# Patient Record
Sex: Female | Born: 1960 | Race: White | Hispanic: No | Marital: Married | Smoking: Current every day smoker
Health system: Southern US, Community
[De-identification: ages and names within clinical notes are randomized; demographics above are authoritative.]

## PROBLEM LIST (undated history)

## (undated) DIAGNOSIS — R0602 Shortness of breath: Secondary | ICD-10-CM

## (undated) DIAGNOSIS — J189 Pneumonia, unspecified organism: Secondary | ICD-10-CM

## (undated) DIAGNOSIS — F419 Anxiety disorder, unspecified: Secondary | ICD-10-CM

## (undated) DIAGNOSIS — R112 Nausea with vomiting, unspecified: Secondary | ICD-10-CM

## (undated) DIAGNOSIS — T41295S Adverse effect of other general anesthetics, sequela: Secondary | ICD-10-CM

## (undated) DIAGNOSIS — K219 Gastro-esophageal reflux disease without esophagitis: Secondary | ICD-10-CM

## (undated) DIAGNOSIS — M1990 Unspecified osteoarthritis, unspecified site: Secondary | ICD-10-CM

## (undated) DIAGNOSIS — I10 Essential (primary) hypertension: Secondary | ICD-10-CM

## (undated) DIAGNOSIS — B029 Zoster without complications: Secondary | ICD-10-CM

## (undated) DIAGNOSIS — F329 Major depressive disorder, single episode, unspecified: Secondary | ICD-10-CM

## (undated) DIAGNOSIS — Z9889 Other specified postprocedural states: Secondary | ICD-10-CM

## (undated) DIAGNOSIS — F32A Depression, unspecified: Secondary | ICD-10-CM

## (undated) DIAGNOSIS — E669 Obesity, unspecified: Secondary | ICD-10-CM

## (undated) HISTORY — PX: TONSILLECTOMY: SUR1361

## (undated) HISTORY — DX: Major depressive disorder, single episode, unspecified: F32.9

## (undated) HISTORY — DX: Gastro-esophageal reflux disease without esophagitis: K21.9

## (undated) HISTORY — DX: Depression, unspecified: F32.A

## (undated) HISTORY — DX: Essential (primary) hypertension: I10

## (undated) HISTORY — PX: OTHER SURGICAL HISTORY: SHX169

## (undated) HISTORY — DX: Obesity, unspecified: E66.9

---

## 1999-01-31 ENCOUNTER — Other Ambulatory Visit: Admission: RE | Admit: 1999-01-31 | Discharge: 1999-01-31 | Payer: Self-pay | Admitting: Obstetrics & Gynecology

## 2000-02-11 ENCOUNTER — Other Ambulatory Visit: Admission: RE | Admit: 2000-02-11 | Discharge: 2000-02-11 | Payer: Self-pay | Admitting: Obstetrics & Gynecology

## 2001-07-06 ENCOUNTER — Encounter: Payer: Self-pay | Admitting: *Deleted

## 2001-07-06 ENCOUNTER — Encounter: Admission: RE | Admit: 2001-07-06 | Discharge: 2001-07-06 | Payer: Self-pay | Admitting: *Deleted

## 2001-07-08 ENCOUNTER — Encounter: Admission: RE | Admit: 2001-07-08 | Discharge: 2001-07-08 | Payer: Self-pay | Admitting: *Deleted

## 2001-07-08 ENCOUNTER — Encounter: Payer: Self-pay | Admitting: *Deleted

## 2001-08-22 ENCOUNTER — Encounter: Payer: Self-pay | Admitting: Family Medicine

## 2001-08-22 ENCOUNTER — Inpatient Hospital Stay (HOSPITAL_COMMUNITY): Admission: EM | Admit: 2001-08-22 | Discharge: 2001-08-24 | Payer: Self-pay | Admitting: Emergency Medicine

## 2001-08-24 ENCOUNTER — Encounter: Payer: Self-pay | Admitting: *Deleted

## 2001-09-10 ENCOUNTER — Encounter: Payer: Self-pay | Admitting: *Deleted

## 2001-09-10 ENCOUNTER — Encounter: Admission: RE | Admit: 2001-09-10 | Discharge: 2001-09-10 | Payer: Self-pay | Admitting: *Deleted

## 2001-12-16 ENCOUNTER — Other Ambulatory Visit: Admission: RE | Admit: 2001-12-16 | Discharge: 2001-12-16 | Payer: Self-pay | Admitting: Obstetrics & Gynecology

## 2002-11-04 ENCOUNTER — Ambulatory Visit (HOSPITAL_COMMUNITY): Admission: RE | Admit: 2002-11-04 | Discharge: 2002-11-04 | Payer: Self-pay | Admitting: Obstetrics & Gynecology

## 2002-11-04 ENCOUNTER — Encounter: Payer: Self-pay | Admitting: Obstetrics & Gynecology

## 2002-11-07 ENCOUNTER — Encounter: Payer: Self-pay | Admitting: Emergency Medicine

## 2002-11-07 ENCOUNTER — Emergency Department (HOSPITAL_COMMUNITY): Admission: EM | Admit: 2002-11-07 | Discharge: 2002-11-07 | Payer: Self-pay | Admitting: Emergency Medicine

## 2002-11-25 ENCOUNTER — Encounter: Admission: RE | Admit: 2002-11-25 | Discharge: 2002-11-25 | Payer: Self-pay

## 2002-12-14 ENCOUNTER — Ambulatory Visit (HOSPITAL_COMMUNITY): Admission: RE | Admit: 2002-12-14 | Discharge: 2002-12-14 | Payer: Self-pay | Admitting: Gastroenterology

## 2002-12-14 ENCOUNTER — Encounter (INDEPENDENT_AMBULATORY_CARE_PROVIDER_SITE_OTHER): Payer: Self-pay | Admitting: Specialist

## 2003-02-14 ENCOUNTER — Other Ambulatory Visit: Admission: RE | Admit: 2003-02-14 | Discharge: 2003-02-14 | Payer: Self-pay | Admitting: Obstetrics & Gynecology

## 2003-02-21 ENCOUNTER — Inpatient Hospital Stay (HOSPITAL_COMMUNITY): Admission: AD | Admit: 2003-02-21 | Discharge: 2003-02-21 | Payer: Self-pay | Admitting: Obstetrics and Gynecology

## 2003-03-17 ENCOUNTER — Ambulatory Visit (HOSPITAL_COMMUNITY): Admission: RE | Admit: 2003-03-17 | Discharge: 2003-03-17 | Payer: Self-pay | Admitting: Neurosurgery

## 2003-03-17 ENCOUNTER — Encounter: Payer: Self-pay | Admitting: Neurosurgery

## 2003-06-14 ENCOUNTER — Emergency Department (HOSPITAL_COMMUNITY): Admission: AD | Admit: 2003-06-14 | Discharge: 2003-06-14 | Payer: Self-pay | Admitting: Family Medicine

## 2003-06-20 ENCOUNTER — Encounter: Admission: RE | Admit: 2003-06-20 | Discharge: 2003-06-20 | Payer: Self-pay | Admitting: Family Medicine

## 2004-03-06 ENCOUNTER — Other Ambulatory Visit: Admission: RE | Admit: 2004-03-06 | Discharge: 2004-03-06 | Payer: Self-pay | Admitting: Obstetrics & Gynecology

## 2005-03-13 ENCOUNTER — Other Ambulatory Visit: Admission: RE | Admit: 2005-03-13 | Discharge: 2005-03-13 | Payer: Self-pay | Admitting: Obstetrics & Gynecology

## 2005-09-23 ENCOUNTER — Emergency Department (HOSPITAL_COMMUNITY): Admission: EM | Admit: 2005-09-23 | Discharge: 2005-09-23 | Payer: Self-pay | Admitting: Emergency Medicine

## 2006-12-24 ENCOUNTER — Emergency Department (HOSPITAL_COMMUNITY): Admission: EM | Admit: 2006-12-24 | Discharge: 2006-12-24 | Payer: Self-pay | Admitting: Emergency Medicine

## 2008-11-14 ENCOUNTER — Encounter
Admission: RE | Admit: 2008-11-14 | Discharge: 2008-11-14 | Payer: Self-pay | Admitting: Physical Medicine and Rehabilitation

## 2009-10-22 ENCOUNTER — Encounter: Admission: RE | Admit: 2009-10-22 | Discharge: 2009-10-22 | Payer: Self-pay | Admitting: Family Medicine

## 2009-10-24 ENCOUNTER — Encounter: Admission: RE | Admit: 2009-10-24 | Discharge: 2009-10-24 | Payer: Self-pay | Admitting: Internal Medicine

## 2009-10-25 ENCOUNTER — Emergency Department (HOSPITAL_COMMUNITY): Admission: EM | Admit: 2009-10-25 | Discharge: 2009-10-25 | Payer: Self-pay | Admitting: Emergency Medicine

## 2009-10-25 ENCOUNTER — Emergency Department (HOSPITAL_COMMUNITY): Admission: EM | Admit: 2009-10-25 | Discharge: 2009-10-26 | Payer: Self-pay | Admitting: Emergency Medicine

## 2009-11-14 ENCOUNTER — Ambulatory Visit (HOSPITAL_COMMUNITY): Admission: RE | Admit: 2009-11-14 | Discharge: 2009-11-14 | Payer: Self-pay | Admitting: Gastroenterology

## 2009-11-16 ENCOUNTER — Inpatient Hospital Stay (HOSPITAL_COMMUNITY): Admission: EM | Admit: 2009-11-16 | Discharge: 2009-11-17 | Payer: Self-pay | Admitting: Emergency Medicine

## 2009-11-17 ENCOUNTER — Encounter (INDEPENDENT_AMBULATORY_CARE_PROVIDER_SITE_OTHER): Payer: Self-pay | Admitting: General Surgery

## 2010-06-16 HISTORY — PX: CHOLECYSTECTOMY: SHX55

## 2010-09-02 ENCOUNTER — Other Ambulatory Visit: Payer: Self-pay | Admitting: Internal Medicine

## 2010-09-02 DIAGNOSIS — R1310 Dysphagia, unspecified: Secondary | ICD-10-CM

## 2010-09-02 LAB — COMPREHENSIVE METABOLIC PANEL
ALT: 22 U/L (ref 0–35)
AST: 24 U/L (ref 0–37)
Albumin: 3.2 g/dL — ABNORMAL LOW (ref 3.5–5.2)
Alkaline Phosphatase: 82 U/L (ref 39–117)
Alkaline Phosphatase: 87 U/L (ref 39–117)
BUN: 10 mg/dL (ref 6–23)
BUN: 8 mg/dL (ref 6–23)
CO2: 23 mEq/L (ref 19–32)
Calcium: 9.2 mg/dL (ref 8.4–10.5)
Chloride: 108 mEq/L (ref 96–112)
GFR calc Af Amer: >60 mL/min (ref >60)
GFR calc non Af Amer: >60 mL/min (ref >60)
Glucose, Bld: 94 mg/dL (ref 70–99)
Potassium: 3.8 mEq/L (ref 3.5–5.1)
Sodium: 139 mEq/L (ref 135–145)
Total Bilirubin: 0.5 mg/dL (ref 0.3–1.2)
Total Protein: 6.1 g/dL (ref 6.0–8.3)
Total Protein: 7 g/dL (ref 6.0–8.3)

## 2010-09-02 LAB — LIPASE, BLOOD: Lipase: 42 U/L (ref 11–59)

## 2010-09-02 LAB — DIFFERENTIAL
Basophils Relative: 1 % (ref 0–1)
Eosinophils Absolute: 0.5 10*3/uL (ref 0.0–0.7)
Lymphs Abs: 4.2 10*3/uL — ABNORMAL HIGH (ref 0.7–4.0)
Monocytes Relative: 6 % (ref 3–12)
Neutro Abs: 4.3 10*3/uL (ref 1.7–7.7)
Neutrophils Relative %: 45 % (ref 43–77)

## 2010-09-02 LAB — URINALYSIS, ROUTINE W REFLEX MICROSCOPIC
Bilirubin Urine: NEGATIVE
Glucose, UA: NEGATIVE mg/dL
Glucose, UA: NEGATIVE mg/dL
Hgb urine dipstick: NEGATIVE
Ketones, ur: NEGATIVE mg/dL
Ketones, ur: NEGATIVE mg/dL
Nitrite: NEGATIVE
Protein, ur: NEGATIVE mg/dL
Protein, ur: NEGATIVE mg/dL
Urobilinogen, UA: 0.2 mg/dL (ref 0.0–1.0)

## 2010-09-02 LAB — CBC
HCT: 40.2 % (ref 36.0–46.0)
HCT: 45.1 % (ref 36.0–46.0)
Hemoglobin: 15.4 g/dL — ABNORMAL HIGH (ref 12.0–15.0)
MCHC: 34.1 g/dL (ref 30.0–36.0)
Platelets: 236 10*3/uL (ref 150–400)
RBC: 4.99 MIL/uL (ref 3.87–5.11)
RDW: 13.9 % (ref 11.5–15.5)
RDW: 14.4 % (ref 11.5–15.5)
WBC: 8 10*3/uL (ref 4.0–10.5)

## 2010-09-03 LAB — DIFFERENTIAL
Basophils Relative: 1 % (ref 0–1)
Eosinophils Absolute: 0.6 10*3/uL (ref 0.0–0.7)
Eosinophils Relative: 7 % — ABNORMAL HIGH (ref 0–5)
Lymphs Abs: 3.6 10*3/uL (ref 0.7–4.0)
Neutrophils Relative %: 46 % (ref 43–77)

## 2010-09-03 LAB — COMPREHENSIVE METABOLIC PANEL
AST: 26 U/L (ref 0–37)
CO2: 22 mEq/L (ref 19–32)
Calcium: 8.9 mg/dL (ref 8.4–10.5)
Creatinine, Ser: 0.97 mg/dL (ref 0.4–1.2)
GFR calc Af Amer: >60 mL/min (ref >60)
GFR calc non Af Amer: >60 mL/min (ref >60)
Glucose, Bld: 109 mg/dL — ABNORMAL HIGH (ref 70–99)
Sodium: 137 mEq/L (ref 135–145)
Total Protein: 6.2 g/dL (ref 6.0–8.3)

## 2010-09-03 LAB — POCT I-STAT, CHEM 8
BUN: 10 mg/dL (ref 6–23)
Chloride: 108 mEq/L (ref 96–112)
HCT: 43 % (ref 36.0–46.0)
Potassium: 4.2 mEq/L (ref 3.5–5.1)

## 2010-09-03 LAB — CBC
HCT: 41 % (ref 36.0–46.0)
MCHC: 35.2 g/dL (ref 30.0–36.0)
MCV: 89 fL (ref 78.0–100.0)
Platelets: 235 10*3/uL (ref 150–400)
WBC: 8.7 10*3/uL (ref 4.0–10.5)

## 2010-09-03 LAB — LIPASE, BLOOD: Lipase: 32 U/L (ref 11–59)

## 2010-09-05 ENCOUNTER — Ambulatory Visit
Admission: RE | Admit: 2010-09-05 | Discharge: 2010-09-05 | Disposition: A | Discharge status: Home / Self Care | Payer: BC Managed Care – PPO | Source: Ambulatory Visit | Attending: Internal Medicine | Admitting: Internal Medicine

## 2010-09-05 DIAGNOSIS — R1310 Dysphagia, unspecified: Secondary | ICD-10-CM

## 2010-09-18 ENCOUNTER — Ambulatory Visit (HOSPITAL_BASED_OUTPATIENT_CLINIC_OR_DEPARTMENT_OTHER)
Admission: RE | Admit: 2010-09-18 | Discharge: 2010-09-18 | Disposition: A | Discharge status: Home / Self Care | Payer: BC Managed Care – PPO | Source: Ambulatory Visit | Attending: Orthopedic Surgery | Admitting: Orthopedic Surgery

## 2010-09-18 DIAGNOSIS — Z79899 Other long term (current) drug therapy: Secondary | ICD-10-CM | POA: Insufficient documentation

## 2010-09-18 DIAGNOSIS — K219 Gastro-esophageal reflux disease without esophagitis: Secondary | ICD-10-CM | POA: Insufficient documentation

## 2010-09-18 DIAGNOSIS — F17200 Nicotine dependence, unspecified, uncomplicated: Secondary | ICD-10-CM | POA: Insufficient documentation

## 2010-09-18 DIAGNOSIS — X58XXXA Exposure to other specified factors, initial encounter: Secondary | ICD-10-CM | POA: Insufficient documentation

## 2010-09-18 DIAGNOSIS — S83289A Other tear of lateral meniscus, current injury, unspecified knee, initial encounter: Secondary | ICD-10-CM | POA: Insufficient documentation

## 2010-09-18 DIAGNOSIS — M948X9 Other specified disorders of cartilage, unspecified sites: Secondary | ICD-10-CM | POA: Insufficient documentation

## 2010-09-18 DIAGNOSIS — I10 Essential (primary) hypertension: Secondary | ICD-10-CM | POA: Insufficient documentation

## 2010-09-18 LAB — POCT I-STAT 4, (NA,K, GLUC, HGB,HCT)
Glucose, Bld: 133 mg/dL — ABNORMAL HIGH (ref 70–99)
HCT: 45 % (ref 36.0–46.0)
Hemoglobin: 15.3 g/dL — ABNORMAL HIGH (ref 12.0–15.0)
Sodium: 136 mEq/L (ref 135–145)

## 2010-09-23 NOTE — Op Note (Signed)
Brittany Moon, Brittany Moon                 ACCOUNT NO.:  [id number]  MEDICAL RECORD NO.:  [id number]            PATIENT TYPE:  LOCATION:                                 FACILITY:  PHYSICIAN:  Ollen Gross, M.D.         DATE OF BIRTH:  DATE OF PROCEDURE:  09/18/2010 DATE OF DISCHARGE:                              OPERATIVE REPORT   PREOPERATIVE DIAGNOSIS:  Right knee lateral meniscal tear.  POSTOPERATIVE DIAGNOSES:  Right knee lateral meniscal tear plus chondral defects medial, lateral, and patellofemoral.  PROCEDURE:  Right knee arthroscopy with meniscal debridement and chondroplasty.  SURGEON:  Ollen Gross, MD  ASSISTANT:  None.  ANESTHESIA:  General.  ESTIMATED BLOOD LOSS:  Minimal.  DRAIN:  None.  COMPLICATIONS:  None.  CONDITION:  Stable to Recovery.  BRIEF CLINICAL NOTE:  Brittany Moon is a 50 year old female with significant right knee pain as well as mechanical type symptoms.  She does have some early deterioration of the joint.  MRI did show meniscal tear.  She also has a painful popping medially.  She presents now for arthroscopy and debridement.  PROCEDURE IN DETAIL:  After successful administration of general anesthetic, a tourniquet was placed high on the right thigh and right lower extremity prepped and draped in usual sterile fashion.  Standard superomedial and inferolateral incisions were made, inflow cannula passed superomedial and camera passed inferolateral.  Arthroscopic visualization proceeds.  Undersurface of patella shows some grade 3 and focal areas of grade 4 change.  There was some unstable cartilage under the patella.  It was mainly in the medial and mid aspect.  The trochlea showed minimal chondromalacia.  There was a lot of synovitis in suprapatellar area.  Medial and lateral gutters were visualized, there were no loose bodies.  Flexion and valgus force applied to the knee and medial compartment was entered.  There was no evidence of any  meniscal tear.  Spinal needle was used to localize the inferomedial portal, small incision made, and dilator placed.  There was about a 2 x 2 cm area on the medial femoral condyle that had some unstable cartilage.  Centrally, there was a small area of exposed bone with this.  I probed it and it was unstable.  I debrided this back to a stable bony base with stable cartilaginous edges.  I then abraded the bone for hopeful formation of fibrocartilage.  The edges were probed and found to be stable.  The medial tibial plateau looked normal.  Intercondylar notch was visualized, the ACL was normal.  Lateral compartment was entered and there was some unstable cartilage on the surface of the lateral tibial plateau.  There was also a tear in the body and posterior horn of the lateral meniscus.  I first debrided the meniscus back to a stable base with baskets and a 4.2-mm shaver and sealed off with the ArthroCare device.  The unstable cartilage on the surface of tibial plateau as well as on the medial femoral condyle was debrided back to stable basis. Size was about 1 x 1 cm on plateau and 1 x 1 cm  on the lateral condyle. The patellofemoral joint was then addressed and the unstable cartilage under the patella was debrided back to a stable cartilaginous base. Once again, there were some areas of exposed bone on the patella.  The synovitis was then debrided with the ArthroCare device.  I again inspected the joint and of note, there appears to be a cyst coming off the medial meniscus.  It was emanating up into the medial gutter.  This potentially could be causing some of the mechanical symptoms.  I debrided the cyst with a shaver and then sealed off the stalk of it with the ArthroCare device.  The joint was inspected further, no other tears, loose bodies, or defects were noted.  The arthroscopic equipment was removed from the inferior portals which were closed with interrupted 4-0 nylon.  A 20 cc of  0.25% Marcaine with epinephrine injected through the inflow cannula and that was removed and that portal was closed with nylon.  A bulky sterile dressing was applied and she was awakened and transported to Recovery in stable condition.     Ollen Gross, M.D.     FA/MEDQ  D:  09/18/2010  T:  09/19/2010  Job:  [id number]  Electronically Signed by Ollen Gross M.D. on 09/23/2010 06:38:57 AM

## 2010-11-01 NOTE — Op Note (Signed)
NAMEANISA, LEANOS                             ACCOUNT NO.:  [id number]   MEDICAL RECORD NO.:  [id number]                   PATIENT TYPE:  AMB   LOCATION:  ENDO                                 FACILITY:  [facility]   PHYSICIAN:  Anselmo Rod, M.D.               DATE OF BIRTH:  05/10/1961   DATE OF PROCEDURE:  12/14/2002  DATE OF DISCHARGE:                                 OPERATIVE REPORT   PROCEDURE PERFORMED:  Colonoscopy with cold biopsies x4.   ENDOSCOPIST:  Anselmo Rod, M.D.   INSTRUMENT USED:  Olympus video colonoscope, (adjustable pediatric scope).   INDICATIONS FOR PROCEDURE:  Rectal bleeding in a 50 year old [place] female.  Rule out colonic polyps, masses, etc.   PREPROCEDURE PREPARATION:  Informed consent was procured from the patient.  The patient was fasted for eight hours prior to the procedure and prepped  with a bottle of magnesium citrate and a gallon of GoLYTELY the night prior  to the procedure.   PREPROCEDURE PHYSICAL:  VITAL SIGNS:  The patient had stable vital signs.  NECK:  Supple.  CHEST: Clear to auscultation.  S1 and S2 regular.  ABDOMEN: Soft with normal bowel sounds.   DESCRIPTION OF PROCEDURE:  The patient was placed in the left lateral  decubitus position and sedated with 80 mg of Demerol and 8 mg of Versed  intravenously.  Once the patient was adequately sedated and maintained on  low flow oxygen and continuous cardiac monitoring, the Olympus video  colonoscope was advanced from the rectum to the cecum without difficulty.  The patient had a fairly good prep.  Two small sessile polyps were biopsied  from 20 cm and another two small sessile polyps were biopsied from 100 cc  with the cold biopsy forceps.  Small internal hemorrhoids were seen on  retroflexion in the rectum.  No masses, large polyps, erosions or  ulcerations were seen.  There was  no evidence of diverticulosis.  The  terminal ileum appeared normal.  The appendiceal orifice and the  ileocecal  valve were clearly visualized and photographed.   IMPRESSION:  1. Four small sessile polyps, biopsied from the colon.  2. No evidence of diverticulosis noted.  3. Normal terminal ileum.    RECOMMENDATIONS:  1. Await pathology results.  2. High fiber diet with liberal fluid intake to help with constipation.  3. I will advise the patient to follow up in the next two weeks or earlier     if need be.                                                 Anselmo Rod, M.D.    JNM/MEDQ  D:  12/14/2002  T:  12/14/2002  Job:  [id number]  cc:   Christella Noa, M.D.  [address] [place]  [place], [place] [address]  Fax: [phone]

## 2010-11-01 NOTE — Discharge Summary (Signed)
[facility]  Patient:    Aralynn, Brake [facility] Visit Number: [id number] MRN: [id number]          Service Type: MED Location: [phone] Attending Physician:  Heather Roberts Dictated by:   Heather Roberts, M.D. Admit Date:  08/22/2001 Discharge Date: 08/24/2001                             Discharge Summary  DISCHARGE DIAGNOSES: 1. Bilateral pneumonia. 2. Hypoxia. 3. Smoker. 4. Gastroesophageal reflux disease. 5. History of depression.  HISTORY OF PRESENT ILLNESS:  Brittany Moon is a 50 year old [place] married female smoker admitted through the emergency room for treatment of pneumonia.  She had a three-day history of fevers, chills, arthralgias, myalgias followed by increasingly productive cough and shortness of breath.  In the emergency room chest x-ray showed bilateral pneumonia.  ABGs on room air showed pH 7.4, pO2 68, pCO2 30.  The patient was tachypneic and in sinus tachycardia.  For physical examination, family/social, past medical history, see dictated History and Physical.  LABORATORY DATA:  Admission Carmicheal count 5, subsequent 14, hemoglobin negative. SGOT, PT minimally elevated at 62 and 73.  Sodium 134.  Blood cultures x2 negative at the time of discharge.  Sputum culture was acceptable pending at the time of discharge. Pulse oximetry at the time of discharge on room air was 95%.  Chest x-ray on admission showed bilateral pneumonia, minimal improvement two days after admission.  HOSPITAL COURSE:  Sanaia was admitted for treatment of hypoxia, pneumonia and begun on Tequin.  She was able to tolerate p.o. medicines, but became dyspneic on getting out of the bed and had slow improvement.  She was discharged in fair condition on August 24, 2001 to her home with home O2 and nebulizer treatments.  She was continued on prednisone, albuterol by nebulizer or inhaler, Tequin, and Biaxin.  Follow-up is to be arranged with Dr. Orson Aloe a week after  discharge at which time LFTs will be repeated.  She was initially having some difficulty tolerating food with nausea but this has resolved at the time of discharge.  She had had a negative gallbladder ultrasound a month prior to admission and upper GI showing hiatal hernia.  CONDITION ON DISCHARGE:  Fair.  DISCHARGE MEDICATIONS: 1. Biaxin 500 XL two q.d. 2. Tequin 400 q.d. 3. Prednisone Dosepak. 4. Albuterol. 5. Zantac. 6. Tessalon Perles. 7. Tussionex. Dictated by:   Heather Roberts, M.D. Attending Physician:  Heather Roberts DD:  08/24/01 TD:  08/26/01 Job: [id number]/[id number]

## 2010-11-01 NOTE — H&P (Signed)
[facility]  Patient:    Brittany Moon, [facility] Visit Number: [id number] MRN: [id number]          Service Type: Attending:  Ronnald Nian, M.D. Dictated by:   Ronnald Nian, M.D. Adm. Date:  08/22/01                           History and Physical  HISTORY OF PRESENT ILLNESS:  This is a 50 year old [place] female who has a three-day history of increasing difficulty with fever, chills, arthralgias, myalgias, followed by increasingly productive cough and shortness of breath. She called me, and I recommended an emergency room visit.  In the emergency room a chest x-ray showed diffuse bilateral pneumonia.  Blood gases showed pH 7.4, pO2 68.1, pCO2 30.2.  SOCIAL HISTORY:  She is married, has no children.  Does not drink but does smoke one pack per day, however, none for the last three days.  ALLERGIES:  None.  PAST MEDICAL HISTORY: 1. Two tubal pregnancies. 2. Tonsillectomy. 3. Breast mass biopsy.  MEDICATIONS:  Zantac for underlying GERD.  Alprazolam on a p.r.n. basis.  REVIEW OF SYSTEMS:  Negative except as above.  FAMILY HISTORY:  Noncontributory.  PHYSICAL EXAMINATION:  GENERAL:  [place] female who is obviously tachypneic but does not appear toxic.  HEENT:  TMs are clear.  Throat is clear.  NECK:  Supple.  Without adenopathy.  Fundi not visualized.  CARDIAC:  Sinus tachycardia.  LUNGS:  Decreased breath sounds with scattered rhonchi.  ABDOMEN:  Decreased bowel sounds with no hepatosplenomegaly, masses, or tenderness.  PELVIC, RECTAL:  Deferred.  EXTREMITIES:  Normal pulses and reflexes, with no edema.  LABORATORY DATA:  Chest x-ray does show diffuse bilateral pneumonia.  Gases as mentioned above.  IMPRESSION:  Pneumonia with hypoxia.  PLAN:  Admitted for IV antibiotics and oxygen therapy. Dictated by:   Ronnald Nian, M.D. Attending:  Ronnald Nian, M.D. DD:  08/22/01 TD:  08/23/01 Job: [id number]

## 2011-02-19 ENCOUNTER — Ambulatory Visit (HOSPITAL_BASED_OUTPATIENT_CLINIC_OR_DEPARTMENT_OTHER)
Admission: RE | Admit: 2011-02-19 | Discharge: 2011-02-19 | Disposition: A | Discharge status: Home / Self Care | Payer: BC Managed Care – PPO | Source: Ambulatory Visit | Attending: Orthopedic Surgery | Admitting: Orthopedic Surgery

## 2011-02-19 DIAGNOSIS — M1710 Unilateral primary osteoarthritis, unspecified knee: Secondary | ICD-10-CM | POA: Insufficient documentation

## 2011-02-19 DIAGNOSIS — K219 Gastro-esophageal reflux disease without esophagitis: Secondary | ICD-10-CM | POA: Insufficient documentation

## 2011-02-19 DIAGNOSIS — Z01812 Encounter for preprocedural laboratory examination: Secondary | ICD-10-CM | POA: Insufficient documentation

## 2011-02-19 DIAGNOSIS — I10 Essential (primary) hypertension: Secondary | ICD-10-CM | POA: Insufficient documentation

## 2011-02-19 DIAGNOSIS — E669 Obesity, unspecified: Secondary | ICD-10-CM | POA: Insufficient documentation

## 2011-02-19 DIAGNOSIS — M23329 Other meniscus derangements, posterior horn of medial meniscus, unspecified knee: Secondary | ICD-10-CM | POA: Insufficient documentation

## 2011-02-19 DIAGNOSIS — F17200 Nicotine dependence, unspecified, uncomplicated: Secondary | ICD-10-CM | POA: Insufficient documentation

## 2011-02-19 LAB — POCT I-STAT 4, (NA,K, GLUC, HGB,HCT)
Glucose, Bld: 154 mg/dL — ABNORMAL HIGH (ref 70–99)
HCT: 51 % — ABNORMAL HIGH (ref 36.0–46.0)
Hemoglobin: 17.3 g/dL — ABNORMAL HIGH (ref 12.0–15.0)
Potassium: 3.5 mEq/L (ref 3.5–5.1)

## 2011-02-23 NOTE — Op Note (Signed)
NAMESHANDELLE, BORRELLI                 ACCOUNT NO.:  [id number]  MEDICAL RECORD NO.:  [id number]  LOCATION:                                 FACILITY:  PHYSICIAN:  Ollen Gross, M.D.    DATE OF BIRTH:  [birth year]  DATE OF PROCEDURE:  02/19/2011 DATE OF DISCHARGE:                              OPERATIVE REPORT   PREOPERATIVE DIAGNOSES: 1. Left knee medial meniscal tear. 2. Right knee osteoarthritis.  POSTOPERATIVE DIAGNOSES: 1. Left knee medial meniscal tear plus chondral defect. 2. Right knee osteoarthritis.  PROCEDURES: 1. Left knee arthroscopy with meniscal debridement and chondroplasty. 2. Right knee cortisone injection.  SURGEON:  Ollen Gross, M.D.  ASSISTANT.:  None.  ANESTHESIA:  General.  ESTIMATED BLOOD LOSS:  Minimal.  DRAINS:  None.  COMPLICATIONS:  None.  CONDITION:  Stable to recovery.  BRIEF CLINICAL NOTE:  Brittany Moon is a 50 year old female with significant left knee pain and mechanical symptoms.  Exam and history suggest meniscal tear.  MRI also suggested a tear.  She has failed nonoperative management, presents for knee arthroscopy and debridement.  In addition, she had a recent right knee arthroscopy which improved the overall mechanical symptoms, but she is having some arthritic-type pain.  She presents for cortisone injection in her right knee.  PROCEDURE IN DETAIL:  After successful administration of general anesthetic, tourniquet was placed on the left thigh, left lower extremity prepped and draped in the usual sterile fashion.  Standard superomedial and inferolateral incisions were made, inflow cannula passed superomedial, camera passed inferolateral.  Arthroscopic visualization proceeds.  Undersurface of patella had some grade 3 changes of unstable cartilage underneath.  The trochlea has one small area of unstable cartilage about 5 x 5 mm.  The mediolateral gutters were visualized.  There were no loose bodies.  Flexion valgus force applied to the  knee and the medial compartment centered.  There is unstable cartilage on the surface of the medial femoral condyle in about a 1 x 2 cm area.  There is also a tear in the posterior horn of the medial meniscus.  Spinal needle was used to localize the inferomedial portal, small incision made and dilator placed.  The meniscus was debrided back to a stable base with baskets and 4.2 mm shaver, and sealed off with the ArthroCare.  The unstable cartilage on the surface of the medial femoral condyle was stapled and this is debrided back to a stable cartilaginous base with the shaver.  Size of defect was about 1 x 2 cm.  The cartilage overlying the bone was very thin.  Intercondylar notch was visualized.  The ACL was normal.  Lateral compartment centered in it looks normal.  The cartilage and the undersurface of patella was then debrided back to a stable base with the shaver.  Each joints again inspected.  No other tears, defects, or loose bodies were noted.  Arthroscopic rim was removed from the inferior portals which were closed with interrupted 4-0 nylon.  20 cc of  0.25% Marcaine with epinephrine injected through the inflow cannula, then that is removed and that portal closed with nylon.  Bulky sterile dressing was applied.  I then prepped the right  knee and injected with a combination of 4 cc of 1% lidocaine and 80 mg of Depo-Medrol.  Knee was then cleaned and Band-Aid placed.  She was subsequently awakened and transported to Recovery in stable condition.     Ollen Gross, M.D.     FA/MEDQ  D:  02/19/2011  T:  02/19/2011  Job:  [id number]  Electronically Signed by Ollen Gross M.D. on 02/23/2011 12:17:31 PM

## 2011-06-02 ENCOUNTER — Other Ambulatory Visit: Payer: Self-pay | Admitting: Orthopedic Surgery

## 2011-06-18 ENCOUNTER — Encounter (INDEPENDENT_AMBULATORY_CARE_PROVIDER_SITE_OTHER): Payer: Self-pay | Admitting: General Surgery

## 2011-06-23 ENCOUNTER — Encounter (INDEPENDENT_AMBULATORY_CARE_PROVIDER_SITE_OTHER): Payer: Self-pay | Admitting: General Surgery

## 2011-09-04 ENCOUNTER — Other Ambulatory Visit: Payer: Self-pay | Admitting: Obstetrics & Gynecology

## 2011-09-16 ENCOUNTER — Other Ambulatory Visit (INDEPENDENT_AMBULATORY_CARE_PROVIDER_SITE_OTHER): Payer: Self-pay | Admitting: General Surgery

## 2011-09-19 NOTE — Telephone Encounter (Signed)
Patient not seen in office since 2011.  Lap Chole by Dr. Ezzard Standing 11/2009.

## 2011-09-22 ENCOUNTER — Other Ambulatory Visit (INDEPENDENT_AMBULATORY_CARE_PROVIDER_SITE_OTHER): Payer: Self-pay | Admitting: Surgery

## 2011-09-22 NOTE — Telephone Encounter (Signed)
If this is [facility] pt then why am i getting this?

## 2011-09-29 NOTE — Telephone Encounter (Signed)
done

## 2011-09-29 NOTE — Telephone Encounter (Signed)
This pt should be seen by a doc before getting prescription meds

## 2011-10-16 IMAGING — NM NM HEPATO W/GB/PHARM/[PERSON_NAME]
2 series · 12 of 12 positions shown · non-contrast
Comparison: none

CLINICAL DATA: Right upper abdominal pain.

[he hepatobiliary · 3.43mm/px · 6 of 6 frames shown (1 of 2)]
[frame 1/6]
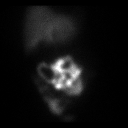
[frame 2/6]
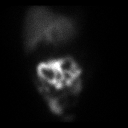
[frame 3/6]
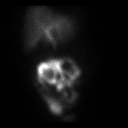
[frame 4/6]
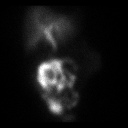
[frame 5/6]
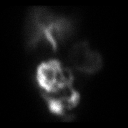
[frame 6/6]
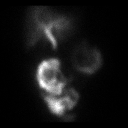

[he hepatobiliary · 3.43mm/px · 6 of 60 frames shown (2 of 2)]
[frame 6/60]
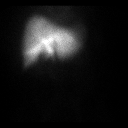
[frame 16/60]
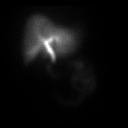
[frame 26/60]
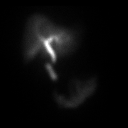
[frame 36/60]
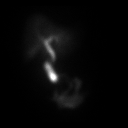
[frame 46/60]
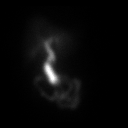
[frame 56/60]
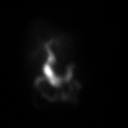

[12 of 12 positions shown; findings below may reference images not displayed]

HEPATOBILIARY SCINTIGRAPHY

Anterior imaging after 9.LmVi Xc99S Choletec IV. There is prompt
clearance of the radiopharmaceutical from the blood pool. Timely
visualization of activity in central bile ducts and small bowel.
After 1 hour, 3.72 mg morphine was infused intravenously and
imaging continued. There is no visualization of the gallbladder of
the third and additional 60 minutes imaging.

IMPRESSION
1. Nonvisualization of the gallbladder consistent with cystic duct
obstruction.

## 2011-11-18 ENCOUNTER — Other Ambulatory Visit: Payer: Self-pay | Admitting: Orthopedic Surgery

## 2011-11-18 ENCOUNTER — Encounter (HOSPITAL_COMMUNITY): Payer: Self-pay | Admitting: Pharmacy Technician

## 2011-11-18 NOTE — Progress Notes (Signed)
Preoperative surgical orders have been place into the [facility] hospital system for Brittany Moon on 11/18/2011, 12:53 PM  by Patrica Duel for surgery on 11/25/11.  Preop Total Knee orders including Bupivacaine On-Q pump, IV Tylenol, and IV Decadron as long as there are no contraindications to the above medications.  Avel Peace, PA-C

## 2011-11-20 ENCOUNTER — Encounter (HOSPITAL_COMMUNITY): Payer: Self-pay

## 2011-11-20 ENCOUNTER — Encounter (HOSPITAL_COMMUNITY)
Admission: RE | Admit: 2011-11-20 | Discharge: 2011-11-20 | Disposition: A | Discharge status: Home / Self Care | Payer: BC Managed Care – PPO | Source: Ambulatory Visit | Attending: Orthopedic Surgery | Admitting: Orthopedic Surgery

## 2011-11-20 DIAGNOSIS — Z01812 Encounter for preprocedural laboratory examination: Secondary | ICD-10-CM | POA: Insufficient documentation

## 2011-11-20 HISTORY — DX: Unspecified osteoarthritis, unspecified site: M19.90

## 2011-11-20 HISTORY — DX: Nausea with vomiting, unspecified: R11.2

## 2011-11-20 HISTORY — DX: Other specified postprocedural states: Z98.890

## 2011-11-20 HISTORY — DX: Zoster without complications: B02.9

## 2011-11-20 LAB — APTT: aPTT: 36 seconds (ref 24–37)

## 2011-11-20 LAB — URINALYSIS, ROUTINE W REFLEX MICROSCOPIC
Glucose, UA: NEGATIVE mg/dL
Hgb urine dipstick: NEGATIVE
Protein, ur: NEGATIVE mg/dL
pH: 5 (ref 5.0–8.0)

## 2011-11-20 LAB — SURGICAL PCR SCREEN: MRSA, PCR: NEGATIVE

## 2011-11-20 NOTE — Pre-Procedure Instructions (Addendum)
Cbc with dif, cmet, lipiid panel, tsh, and hemaglobin a 1 c  11-12-2011 dr polite on chart ekg 11-12-2011 dr polite on chart Chest 2 view xray 11-14-2011 dr polite on chart Medical clearance note dr polite on chart

## 2011-11-20 NOTE — Patient Instructions (Addendum)
20 Brittany Moon  11/20/2011   Your procedure is scheduled on:  11-25-2011  Report to [facility] at 1000  AM.  Call this number if you have problems the morning of surgery: [phone]   Remember:   Do not eat food or drink liquids:After Midnight.  .  Take these medicines the morning of surgery with A SIP OF WATER: flonase nasal spray if needed,              Protonix, klonopin if needed, hydrocodone if needed.   Do not wear jewelry or make up.  Do not wear lotions, powders, or perfumes.Do not wear deodorant.    Do not bring valuables to the hospital.  Contacts, dentures or bridgework may not be worn into surgery.  Leave suitcase in the car. After surgery it may be brought to your room.  For patients admitted to the hospital, checkout time is 11:00 AM the day of   discharge.     Special Instructions: CHG Shower Use Special Wash: 1/2 bottle night before surgery and 1/2 bottle morning of surgery, use regular soap on face and front and back private area.no shaving for 2 days before showers   Please read over the following fact sheets that you were given: MRSA Information, blood fact sheet, incentive spirometer fact sheet  [facility] pre op nurse phone number [phone], call if needed

## 2011-11-25 ENCOUNTER — Inpatient Hospital Stay (HOSPITAL_COMMUNITY)
Admission: RE | Admit: 2011-11-25 | Payer: BC Managed Care – PPO | Source: Ambulatory Visit | Admitting: Orthopedic Surgery

## 2011-11-25 ENCOUNTER — Encounter (HOSPITAL_COMMUNITY): Admission: RE | Payer: Self-pay | Source: Ambulatory Visit

## 2011-11-25 SURGERY — ARTHROPLASTY, KNEE, TOTAL
Anesthesia: Choice | Site: Knee | Laterality: Right

## 2012-01-07 ENCOUNTER — Encounter (HOSPITAL_COMMUNITY): Payer: Self-pay | Admitting: Pharmacy Technician

## 2012-01-08 ENCOUNTER — Encounter (HOSPITAL_COMMUNITY)
Admission: RE | Admit: 2012-01-08 | Discharge: 2012-01-08 | Disposition: A | Discharge status: Home / Self Care | Payer: BC Managed Care – PPO | Source: Ambulatory Visit | Attending: Orthopedic Surgery | Admitting: Orthopedic Surgery

## 2012-01-08 ENCOUNTER — Encounter (HOSPITAL_COMMUNITY): Payer: Self-pay

## 2012-01-08 DIAGNOSIS — I10 Essential (primary) hypertension: Secondary | ICD-10-CM

## 2012-01-08 DIAGNOSIS — R112 Nausea with vomiting, unspecified: Secondary | ICD-10-CM

## 2012-01-08 DIAGNOSIS — R0602 Shortness of breath: Secondary | ICD-10-CM

## 2012-01-08 DIAGNOSIS — T41295S Adverse effect of other general anesthetics, sequela: Secondary | ICD-10-CM

## 2012-01-08 HISTORY — DX: Shortness of breath: R06.02

## 2012-01-08 HISTORY — DX: Adverse effect of other general anesthetics, sequela: T41.295S

## 2012-01-08 HISTORY — DX: Other specified postprocedural states: R11.2

## 2012-01-08 HISTORY — DX: Essential (primary) hypertension: I10

## 2012-01-08 HISTORY — PX: KNEE ARTHROSCOPY: SHX127

## 2012-01-08 LAB — COMPREHENSIVE METABOLIC PANEL
ALT: 26 U/L (ref 0–35)
AST: 25 U/L (ref 0–37)
Albumin: 3.8 g/dL (ref 3.5–5.2)
Alkaline Phosphatase: 91 U/L (ref 39–117)
Calcium: 9.9 mg/dL (ref 8.4–10.5)
Glucose, Bld: 90 mg/dL (ref 70–99)
Potassium: 3.8 mEq/L (ref 3.5–5.1)
Sodium: 135 mEq/L (ref 135–145)
Total Protein: 7.3 g/dL (ref 6.0–8.3)

## 2012-01-08 LAB — URINALYSIS, ROUTINE W REFLEX MICROSCOPIC
Glucose, UA: NEGATIVE mg/dL
Hgb urine dipstick: NEGATIVE
Specific Gravity, Urine: 1.025 (ref 1.005–1.030)

## 2012-01-08 LAB — URINE MICROSCOPIC-ADD ON

## 2012-01-08 LAB — CBC
Hemoglobin: 15.8 g/dL — ABNORMAL HIGH (ref 12.0–15.0)
MCHC: 34.8 g/dL (ref 30.0–36.0)
Platelets: 244 10*3/uL (ref 150–400)
RDW: 13.3 % (ref 11.5–15.5)

## 2012-01-08 LAB — SURGICAL PCR SCREEN
MRSA, PCR: NEGATIVE
Staphylococcus aureus: NEGATIVE

## 2012-01-08 LAB — APTT: aPTT: 39 seconds — ABNORMAL HIGH (ref 24–37)

## 2012-01-08 NOTE — Patient Instructions (Signed)
20 DEVOIRY CORRIHER  01/08/2012   Your procedure is scheduled on:  7-29 -2013  Report to [facility] at       1000 AM .  Call this number if you have problems the morning of surgery: [phone]/ or Presurgical testing [phone]   Remember:   Do not eat food:After Midnight.  May have clear liquids:up to 6 Hours before arrival. Nothing after : 0600 AM  Clear liquids include soda, tea, black coffee, apple or grape juice, broth.  Take these medicines the morning of surgery with A SIP OF WATER: Clonazepam, Hydrocodone, Protonix.   Do not wear jewelry, make-up or nail polish.  Do not wear lotions, powders, or perfumes. You may wear deodorant.  Do not shave 48 hours prior to surgery.(face and neck okay, no shaving of legs)  Do not bring valuables to the hospital.  Contacts, dentures or bridgework may not be worn into surgery.  Leave suitcase in the car. After surgery it may be brought to your room.  For patients admitted to the hospital, checkout time is 11:00 AM the day of discharge.   Patients discharged the day of surgery will not be allowed to drive home.  Name and phone number of your driver: spouse  Special Instructions: [facility] Shower Use Special Wash: 1/2 bottle night before surgery and 1/2 bottle morning of surgery.(avoid face and genitals)   Please read over the following fact sheets that you were given: MRSA Information, Blood Transfusion fact sheet, Incentive Spirometry Instruction.

## 2012-01-09 NOTE — H&P (Signed)
Brittany Moon DOB: [birth year]  Chief Complaint: right knee pain  History of Present Illness The patient is a 51 year old female who comes in today for a preoperative History and Physical. The patient is scheduled for a right total knee arthroplasty to be performed by Dr. Gus Rankin. Aluisio, MD at [facility] on Monday January 12, 2012 . She is ready for her knee replacement. They both hurt. She says she gets some swelling at times.They have been treated conservatively in the past for the above stated problem and despite conservative measures, they continue to have progressive pain and severe functional limitations and dysfunction. They have failed non-operative management including home exercise, medications, and injections. It is felt that they would benefit from undergoing total joint replacement. Risks and benefits of the procedure have been discussed with the patient and they elect to proceed with surgery. There are no active contraindications to surgery such as ongoing infection or rapidly progressive neurological disease.    Problem List/Past Medical Osteoarthritis, knee (715.96) Gastroesophageal Reflux Disease Anxiety Disorder S/P arthroscopy of knee (V45.89) Disorder, bone/cartilage NEC (733.99). 10/07/2010 Pain in joint, lower leg (719.46). 10/07/2010 Derangement, lateral meniscus NEC (717.49). 10/07/2010 Hypercholesterolemia Acid Reflux   Allergies Adhesive Tape    Family History Mother. Cancer, Cerebrovascular Accident. esophageal cancer 2001, CVA 2005 Father. Cancer, Heart disease, Non-Insulin Dependent Diabetes Mellitus. deceased age 70 due to heart disease   Social History Non drinker / no alcohol Use Tobacco use. Current every day smoker, Smokes < 1 pack of cigarettes per day. Exercise. Light. Current work status. Unemployed. Alcohol use. Formerly drank alcohol. Marital status. Married. Children. 0. Post-Surgical Plans. home with  husband Advance Directives. none   Medication History Robaxin (500MG  Tablet, 1 Oral PO Q 6-8HRS PRN PAIN, Active. KlonoPIN (1MG  Tablet, Oral three times daily) Active. Flonase (50MCG/DOSE Inhaler, Nasal as needed) Active. Protonix ( Oral) Specific dose unknown - Active. Hydrocodone-Acetaminophen (10-325MG  Tablet, Oral) Active.   Past Surgical History Arthroscopic Knee Surgery - Both Cholecystectomy Exploratory Lap. for Ectopic Pregnancy Salpingectomy; Unilateral. Right Side Lumpectomy. Right Breast (Noncancerous) Tubal Ligation Tonsillectomy    Review of Systems General:Not Present- Chills, Fever, Night Sweats, Fatigue, Weight Gain, Weight Loss and Memory Loss. Skin:Not Present- Hives, Itching, Rash, Eczema and Lesions. HEENT:Not Present- Tinnitus, Headache, Double Vision, Visual Loss, Hearing Loss and Dentures. Respiratory:Not Present- Shortness of breath with exertion, Shortness of breath at rest, Allergies, Coughing up blood and Chronic Cough. Cardiovascular:Not Present- Chest Pain, Racing/skipping heartbeats, Difficulty Breathing Lying Down, Murmur, Swelling and Palpitations. Gastrointestinal:Not Present- Bloody Stool, Heartburn, Abdominal Pain, Vomiting, Nausea, Constipation, Diarrhea, Difficulty Swallowing, Jaundice and Loss of appetitie. Female Genitourinary:Not Present- Blood in Urine, Urinary frequency, Weak urinary stream, Discharge, Flank Pain, Incontinence, Painful Urination, Urgency, Urinary Retention and Urinating at Night. Musculoskeletal:Present- Joint Swelling, Joint Pain and Morning Stiffness. Not Present- Muscle Weakness, Muscle Pain, Back Pain and Spasms. Neurological:Not Present- Tremor, Dizziness, Blackout spells, Paralysis, Difficulty with balance and Weakness. Psychiatric:Not Present- Insomnia.   Vitals Weight: 210 lb Height: 65 in Body Surface Area: 2.09 m Body Mass Index: 34.95 kg/m Pulse: 70 (Regular) Resp.: 16  (Unlabored) BP: 137/83 (Sitting, Left Arm, Standard)    Physical Exam General Mental Status - Alert, cooperative and good historian. General Appearance- pleasant. Not in acute distress. Orientation- Oriented X3. Build & Nutrition- Well nourished and Well developed. Head and Neck Head- normocephalic, atraumatic . Neck Global Assessment- supple. no bruit auscultated on the right and no bruit auscultated on the left. Eye Pupil- Bilateral- Regular  and Round. Motion- Bilateral- EOMI. Chest and Lung Exam Auscultation: Breath sounds:- clear at anterior chest wall and - clear at posterior chest wall. Adventitious sounds:- No Adventitious sounds. Cardiovascular Auscultation:Rhythm- Regular rate and rhythm. Heart Sounds- S1 WNL and S2 WNL. Murmurs & Other Heart Sounds:Auscultation of the heart reveals - No Murmurs. Abdomen Palpation/Percussion:Tenderness- Abdomen is non-tender to palpation. Rigidity (guarding)- Abdomen is soft. Auscultation:Auscultation of the abdomen reveals - Bowel sounds normal. Female Genitourinary Not done, not pertinent to present illness Peripheral Vascular Upper Extremity: Palpation:- Pulses bilaterally normal. Lower Extremity: Palpation:- Pulses bilaterally normal. Neurologic Examination of related systems reveals - normal muscle strength and tone in all extremities. Neurologic evaluation reveals - normal sensation and upper and lower extremity deep tendon reflexes intact bilaterally . Musculoskeletal Her right knee shows no effusion. Range is 5 to about 120. She is somewhat tender medial greater than lateral joint with no instability noted.     Assessment & Plan Osteoarthritis, knee (715.96) Right total knee arthroplasty      Dimitri Ped, PA-C

## 2012-01-12 ENCOUNTER — Encounter (HOSPITAL_COMMUNITY): Payer: Self-pay | Admitting: *Deleted

## 2012-01-12 ENCOUNTER — Encounter (HOSPITAL_COMMUNITY): Payer: Self-pay | Admitting: Anesthesiology

## 2012-01-12 ENCOUNTER — Other Ambulatory Visit: Payer: Self-pay | Admitting: Orthopedic Surgery

## 2012-01-12 ENCOUNTER — Encounter (HOSPITAL_COMMUNITY)
Admission: RE | Disposition: A | Discharge status: Home Health | Payer: Self-pay | Source: Ambulatory Visit | Attending: Orthopedic Surgery

## 2012-01-12 ENCOUNTER — Inpatient Hospital Stay (HOSPITAL_COMMUNITY)
Admission: RE | Admit: 2012-01-12 | Discharge: 2012-01-15 | DRG: 209 | Disposition: A | Discharge status: Home Health | Payer: BC Managed Care – PPO | Source: Ambulatory Visit | Attending: Orthopedic Surgery | Admitting: Orthopedic Surgery

## 2012-01-12 ENCOUNTER — Ambulatory Visit (HOSPITAL_COMMUNITY): Payer: BC Managed Care – PPO | Admitting: Anesthesiology

## 2012-01-12 DIAGNOSIS — I10 Essential (primary) hypertension: Secondary | ICD-10-CM | POA: Diagnosis present

## 2012-01-12 DIAGNOSIS — IMO0002 Reserved for concepts with insufficient information to code with codable children: Principal | ICD-10-CM | POA: Diagnosis present

## 2012-01-12 DIAGNOSIS — K219 Gastro-esophageal reflux disease without esophagitis: Secondary | ICD-10-CM | POA: Diagnosis present

## 2012-01-12 DIAGNOSIS — Z96659 Presence of unspecified artificial knee joint: Secondary | ICD-10-CM

## 2012-01-12 DIAGNOSIS — Z6834 Body mass index (BMI) 34.0-34.9, adult: Secondary | ICD-10-CM

## 2012-01-12 DIAGNOSIS — M25569 Pain in unspecified knee: Secondary | ICD-10-CM | POA: Diagnosis present

## 2012-01-12 DIAGNOSIS — M179 Osteoarthritis of knee, unspecified: Secondary | ICD-10-CM | POA: Diagnosis present

## 2012-01-12 DIAGNOSIS — Z01812 Encounter for preprocedural laboratory examination: Secondary | ICD-10-CM

## 2012-01-12 DIAGNOSIS — M1710 Unilateral primary osteoarthritis, unspecified knee: Secondary | ICD-10-CM | POA: Diagnosis present

## 2012-01-12 DIAGNOSIS — E669 Obesity, unspecified: Secondary | ICD-10-CM | POA: Diagnosis present

## 2012-01-12 HISTORY — PX: TOTAL KNEE ARTHROPLASTY: SHX125

## 2012-01-12 LAB — TYPE AND SCREEN: Antibody Screen: NEGATIVE

## 2012-01-12 SURGERY — ARTHROPLASTY, KNEE, TOTAL
Anesthesia: Spinal | Site: Knee | Laterality: Right | Wound class: Clean

## 2012-01-12 MED ORDER — FENTANYL CITRATE 0.05 MG/ML IJ SOLN
INTRAMUSCULAR | Status: AC
  Filled 2012-01-12: qty 2

## 2012-01-12 MED ORDER — BISACODYL 10 MG RE SUPP: 10.0000 mg | Freq: Every day | RECTAL | Status: DC | PRN

## 2012-01-12 MED ORDER — ACETAMINOPHEN 10 MG/ML IV SOLN
1000.0000 mg | Freq: Four times a day (QID) | INTRAVENOUS | Status: AC
  Administered 2012-01-12 – 2012-01-13 (×4): 1000 mg via INTRAVENOUS
  Filled 2012-01-12 (×6): qty 100

## 2012-01-12 MED ORDER — KCL IN DEXTROSE-NACL 20-5-0.9 MEQ/L-%-% IV SOLN
INTRAVENOUS | Status: DC
  Administered 2012-01-12: 75 mL/h via INTRAVENOUS
  Filled 2012-01-12 (×3): qty 1000

## 2012-01-12 MED ORDER — DIPHENHYDRAMINE HCL 12.5 MG/5ML PO ELIX: 12.5000 mg | ORAL_SOLUTION | Freq: Four times a day (QID) | ORAL | Status: DC | PRN

## 2012-01-12 MED ORDER — CEFAZOLIN SODIUM 1-5 GM-% IV SOLN
1.0000 g | Freq: Four times a day (QID) | INTRAVENOUS | Status: AC
  Administered 2012-01-12 – 2012-01-13 (×2): 1 g via INTRAVENOUS
  Filled 2012-01-12 (×2): qty 50

## 2012-01-12 MED ORDER — MORPHINE SULFATE (PF) 1 MG/ML IV SOLN
INTRAVENOUS | Status: DC
  Administered 2012-01-12: 15:00:00 via INTRAVENOUS
  Administered 2012-01-12: 12 mg via INTRAVENOUS

## 2012-01-12 MED ORDER — LIDOCAINE HCL (CARDIAC) 20 MG/ML IV SOLN
INTRAVENOUS | Status: DC | PRN
  Administered 2012-01-12: 100 mg via INTRAVENOUS

## 2012-01-12 MED ORDER — MENTHOL 3 MG MT LOZG
1.0000 | LOZENGE | OROMUCOSAL | Status: DC | PRN
  Filled 2012-01-12: qty 9

## 2012-01-12 MED ORDER — DIPHENHYDRAMINE HCL 50 MG/ML IJ SOLN: 12.5000 mg | Freq: Four times a day (QID) | INTRAMUSCULAR | Status: DC | PRN

## 2012-01-12 MED ORDER — TRAMADOL HCL 50 MG PO TABS
50.0000 mg | ORAL_TABLET | Freq: Four times a day (QID) | ORAL | Status: DC | PRN
  Administered 2012-01-13 – 2012-01-15 (×6): 100 mg via ORAL
  Filled 2012-01-12 (×6): qty 2

## 2012-01-12 MED ORDER — BUPIVACAINE 0.25 % ON-Q PUMP SINGLE CATH 300ML
300.0000 mL | INJECTION | Status: DC
  Filled 2012-01-12: qty 300

## 2012-01-12 MED ORDER — BUPIVACAINE 0.25 % ON-Q PUMP SINGLE CATH 300ML
INJECTION | Status: AC
  Filled 2012-01-12: qty 300

## 2012-01-12 MED ORDER — SODIUM CHLORIDE 0.9 % IJ SOLN: 9.0000 mL | INTRAMUSCULAR | Status: DC | PRN

## 2012-01-12 MED ORDER — ONDANSETRON HCL 4 MG/2ML IJ SOLN: 4.0000 mg | Freq: Four times a day (QID) | INTRAMUSCULAR | Status: DC | PRN

## 2012-01-12 MED ORDER — PROMETHAZINE HCL 25 MG/ML IJ SOLN
6.2500 mg | INTRAMUSCULAR | Status: DC | PRN
  Administered 2012-01-12: 6.25 mg via INTRAVENOUS

## 2012-01-12 MED ORDER — DEXAMETHASONE SODIUM PHOSPHATE 10 MG/ML IJ SOLN: 10.0000 mg | Freq: Once | INTRAMUSCULAR | Status: DC

## 2012-01-12 MED ORDER — OXYCODONE HCL 5 MG PO TABS
5.0000 mg | ORAL_TABLET | ORAL | Status: DC | PRN
  Administered 2012-01-13 (×6): 10 mg via ORAL
  Administered 2012-01-13: 5 mg via ORAL
  Administered 2012-01-14 – 2012-01-15 (×11): 10 mg via ORAL
  Filled 2012-01-12 (×6): qty 2
  Filled 2012-01-12: qty 1
  Filled 2012-01-12 (×10): qty 2

## 2012-01-12 MED ORDER — BUPIVACAINE ON-Q PAIN PUMP (FOR ORDER SET NO CHG)
INJECTION | Status: DC
  Filled 2012-01-12: qty 1

## 2012-01-12 MED ORDER — CEFAZOLIN SODIUM-DEXTROSE 2-3 GM-% IV SOLR
INTRAVENOUS | Status: AC
  Filled 2012-01-12: qty 50

## 2012-01-12 MED ORDER — MORPHINE SULFATE (PF) 1 MG/ML IV SOLN
INTRAVENOUS | Status: DC
  Administered 2012-01-12 – 2012-01-13 (×2): via INTRAVENOUS
  Administered 2012-01-13: 10 mg via INTRAVENOUS
  Filled 2012-01-12 (×2): qty 25

## 2012-01-12 MED ORDER — METOCLOPRAMIDE HCL 10 MG PO TABS
5.0000 mg | ORAL_TABLET | Freq: Three times a day (TID) | ORAL | Status: DC | PRN
  Administered 2012-01-14: 10 mg via ORAL
  Filled 2012-01-12: qty 1

## 2012-01-12 MED ORDER — CEFAZOLIN SODIUM-DEXTROSE 2-3 GM-% IV SOLR
2.0000 g | INTRAVENOUS | Status: AC
  Administered 2012-01-12: 2 g via INTRAVENOUS

## 2012-01-12 MED ORDER — CLONAZEPAM 1 MG PO TABS
1.0000 mg | ORAL_TABLET | Freq: Three times a day (TID) | ORAL | Status: DC | PRN
  Administered 2012-01-13 – 2012-01-15 (×5): 1 mg via ORAL
  Filled 2012-01-12 (×5): qty 1

## 2012-01-12 MED ORDER — ONDANSETRON HCL 4 MG/2ML IJ SOLN
INTRAMUSCULAR | Status: DC | PRN
  Administered 2012-01-12: 4 mg via INTRAVENOUS

## 2012-01-12 MED ORDER — ONDANSETRON HCL 4 MG PO TABS: 4.0000 mg | ORAL_TABLET | Freq: Four times a day (QID) | ORAL | Status: DC | PRN

## 2012-01-12 MED ORDER — MORPHINE SULFATE (PF) 1 MG/ML IV SOLN
INTRAVENOUS | Status: AC
  Filled 2012-01-12: qty 25

## 2012-01-12 MED ORDER — BUPIVACAINE 0.25 % ON-Q PUMP SINGLE CATH 100 ML
INJECTION | Status: DC | PRN
  Administered 2012-01-12: 300 mL

## 2012-01-12 MED ORDER — NALOXONE HCL 0.4 MG/ML IJ SOLN: 0.4000 mg | INTRAMUSCULAR | Status: DC | PRN

## 2012-01-12 MED ORDER — PANTOPRAZOLE SODIUM 40 MG PO TBEC
40.0000 mg | DELAYED_RELEASE_TABLET | Freq: Every day | ORAL | Status: DC | PRN
  Administered 2012-01-13: 40 mg via ORAL
  Filled 2012-01-12: qty 1

## 2012-01-12 MED ORDER — ACETAMINOPHEN 10 MG/ML IV SOLN
INTRAVENOUS | Status: DC | PRN
  Administered 2012-01-12: 1000 mg via INTRAVENOUS

## 2012-01-12 MED ORDER — POLYETHYLENE GLYCOL 3350 17 G PO PACK: 17.0000 g | PACK | Freq: Every day | ORAL | Status: DC | PRN

## 2012-01-12 MED ORDER — PROMETHAZINE HCL 25 MG/ML IJ SOLN
INTRAMUSCULAR | Status: AC
  Filled 2012-01-12: qty 1

## 2012-01-12 MED ORDER — ACETAMINOPHEN 325 MG PO TABS
650.0000 mg | ORAL_TABLET | Freq: Four times a day (QID) | ORAL | Status: DC | PRN
  Administered 2012-01-14 (×2): 650 mg via ORAL
  Filled 2012-01-12 (×2): qty 2

## 2012-01-12 MED ORDER — METHOCARBAMOL 100 MG/ML IJ SOLN
500.0000 mg | Freq: Four times a day (QID) | INTRAVENOUS | Status: DC | PRN
  Administered 2012-01-12: 500 mg via INTRAVENOUS
  Filled 2012-01-12 (×2): qty 5

## 2012-01-12 MED ORDER — FLEET ENEMA 7-19 GM/118ML RE ENEM: 1.0000 | ENEMA | Freq: Once | RECTAL | Status: AC | PRN

## 2012-01-12 MED ORDER — FLUTICASONE PROPIONATE 50 MCG/ACT NA SUSP
2.0000 | Freq: Every day | NASAL | Status: DC | PRN
  Filled 2012-01-12: qty 16

## 2012-01-12 MED ORDER — ONDANSETRON HCL 4 MG/2ML IJ SOLN
4.0000 mg | Freq: Four times a day (QID) | INTRAMUSCULAR | Status: DC | PRN
  Administered 2012-01-13: 4 mg via INTRAVENOUS
  Filled 2012-01-12: qty 2

## 2012-01-12 MED ORDER — LACTATED RINGERS IV SOLN
INTRAVENOUS | Status: DC | PRN
  Administered 2012-01-12 (×3): via INTRAVENOUS

## 2012-01-12 MED ORDER — FENTANYL CITRATE 0.05 MG/ML IJ SOLN
50.0000 ug | INTRAMUSCULAR | Status: DC | PRN
  Administered 2012-01-12: 100 ug via INTRAVENOUS

## 2012-01-12 MED ORDER — HYDROMORPHONE HCL PF 1 MG/ML IJ SOLN: 0.2500 mg | INTRAMUSCULAR | Status: DC | PRN

## 2012-01-12 MED ORDER — PROPOFOL 10 MG/ML IV EMUL
INTRAVENOUS | Status: DC | PRN
  Administered 2012-01-12: 75 ug/kg/min via INTRAVENOUS

## 2012-01-12 MED ORDER — BUPIVACAINE IN DEXTROSE 0.75-8.25 % IT SOLN
INTRATHECAL | Status: DC | PRN
  Administered 2012-01-12: 12 mg via INTRATHECAL

## 2012-01-12 MED ORDER — DIPHENHYDRAMINE HCL 12.5 MG/5ML PO ELIX
12.5000 mg | ORAL_SOLUTION | ORAL | Status: DC | PRN
Start: 2012-01-12 — End: 2012-01-12

## 2012-01-12 MED ORDER — PHENOL 1.4 % MT LIQD: 1.0000 | OROMUCOSAL | Status: DC | PRN

## 2012-01-12 MED ORDER — BUPIVACAINE 0.25 % ON-Q PUMP SINGLE CATH 300ML: 300.0000 mL | INJECTION | Status: DC

## 2012-01-12 MED ORDER — METOCLOPRAMIDE HCL 5 MG/ML IJ SOLN
5.0000 mg | Freq: Three times a day (TID) | INTRAMUSCULAR | Status: DC | PRN
  Administered 2012-01-13: 10 mg via INTRAVENOUS
  Filled 2012-01-12: qty 2

## 2012-01-12 MED ORDER — ACETAMINOPHEN 10 MG/ML IV SOLN
INTRAVENOUS | Status: AC
  Filled 2012-01-12: qty 100

## 2012-01-12 MED ORDER — ACETAMINOPHEN 10 MG/ML IV SOLN
1000.0000 mg | Freq: Once | INTRAVENOUS | Status: DC
  Filled 2012-01-12: qty 100

## 2012-01-12 MED ORDER — SODIUM CHLORIDE 0.9 % IV SOLN: INTRAVENOUS | Status: DC

## 2012-01-12 MED ORDER — ACETAMINOPHEN 650 MG RE SUPP: 650.0000 mg | Freq: Four times a day (QID) | RECTAL | Status: DC | PRN

## 2012-01-12 MED ORDER — MIDAZOLAM HCL 5 MG/5ML IJ SOLN
INTRAMUSCULAR | Status: DC | PRN
  Administered 2012-01-12: 2 mg via INTRAVENOUS

## 2012-01-12 MED ORDER — RIVAROXABAN 10 MG PO TABS
10.0000 mg | ORAL_TABLET | Freq: Every day | ORAL | Status: DC
  Administered 2012-01-13 – 2012-01-15 (×3): 10 mg via ORAL
  Filled 2012-01-12 (×4): qty 1

## 2012-01-12 MED ORDER — METHOCARBAMOL 500 MG PO TABS
500.0000 mg | ORAL_TABLET | Freq: Four times a day (QID) | ORAL | Status: DC | PRN
  Administered 2012-01-13 – 2012-01-15 (×9): 500 mg via ORAL
  Filled 2012-01-12 (×8): qty 1

## 2012-01-12 MED ORDER — KCL IN DEXTROSE-NACL 20-5-0.45 MEQ/L-%-% IV SOLN
INTRAVENOUS | Status: AC
  Filled 2012-01-12: qty 1000

## 2012-01-12 MED ORDER — EPHEDRINE SULFATE 50 MG/ML IJ SOLN
INTRAMUSCULAR | Status: DC | PRN
  Administered 2012-01-12: 10 mg via INTRAVENOUS

## 2012-01-12 MED ORDER — DOCUSATE SODIUM 100 MG PO CAPS
100.0000 mg | ORAL_CAPSULE | Freq: Two times a day (BID) | ORAL | Status: DC
Start: 2012-01-12 — End: 2012-01-15
  Administered 2012-01-12 – 2012-01-15 (×6): 100 mg via ORAL

## 2012-01-12 SURGICAL SUPPLY — 55 items
BAG SPEC THK2 15X12 ZIP CLS (MISCELLANEOUS) ×1
BAG ZIPLOCK 12X15 (MISCELLANEOUS) ×2 IMPLANT
BANDAGE ELASTIC 6 VELCRO ST LF (GAUZE/BANDAGES/DRESSINGS) ×2 IMPLANT
BANDAGE ESMARK 6X9 LF (GAUZE/BANDAGES/DRESSINGS) ×1 IMPLANT
BANDAGE GAUZE ELAST BULKY 4 IN (GAUZE/BANDAGES/DRESSINGS) ×1 IMPLANT
BLADE SAG 18X100X1.27 (BLADE) ×2 IMPLANT
BLADE SAW SGTL 11.0X1.19X90.0M (BLADE) ×2 IMPLANT
BNDG CMPR 9X6 STRL LF SNTH (GAUZE/BANDAGES/DRESSINGS) ×1
BNDG ESMARK 6X9 LF (GAUZE/BANDAGES/DRESSINGS) ×2
BOWL SMART MIX CTS (DISPOSABLE) ×2 IMPLANT
CATH KIT ON-Q SILVERSOAK 5 (CATHETERS) ×1 IMPLANT
CATH KIT ON-Q SILVERSOAK 5IN (CATHETERS) ×2 IMPLANT
CEMENT HV SMART SET (Cement) ×4 IMPLANT
CLOTH BEACON ORANGE TIMEOUT ST (SAFETY) ×2 IMPLANT
CUFF TOURN SGL QUICK 34 (TOURNIQUET CUFF) ×2
CUFF TRNQT CYL 34X4X40X1 (TOURNIQUET CUFF) ×1 IMPLANT
DRAPE EXTREMITY T 121X128X90 (DRAPE) ×2 IMPLANT
DRAPE POUCH INSTRU U-SHP 10X18 (DRAPES) ×2 IMPLANT
DRAPE U-SHAPE 47X51 STRL (DRAPES) ×2 IMPLANT
DRSG ADAPTIC 3X8 NADH LF (GAUZE/BANDAGES/DRESSINGS) ×2 IMPLANT
DRSG EMULSION OIL 3X16 NADH (GAUZE/BANDAGES/DRESSINGS) ×1 IMPLANT
DURAPREP 26ML APPLICATOR (WOUND CARE) ×2 IMPLANT
ELECT REM PT RETURN 9FT ADLT (ELECTROSURGICAL) ×2
ELECTRODE REM PT RTRN 9FT ADLT (ELECTROSURGICAL) ×1 IMPLANT
EVACUATOR 1/8 PVC DRAIN (DRAIN) ×2 IMPLANT
FACESHIELD LNG OPTICON STERILE (SAFETY) ×10 IMPLANT
GLOVE BIO SURGEON STRL SZ7.5 (GLOVE) ×2 IMPLANT
GLOVE BIO SURGEON STRL SZ8 (GLOVE) ×2 IMPLANT
GLOVE BIOGEL PI IND STRL 8 (GLOVE) ×2 IMPLANT
GLOVE BIOGEL PI INDICATOR 8 (GLOVE) ×2
GOWN STRL NON-REIN LRG LVL3 (GOWN DISPOSABLE) ×2 IMPLANT
GOWN STRL REIN XL XLG (GOWN DISPOSABLE) ×2 IMPLANT
HANDPIECE INTERPULSE COAX TIP (DISPOSABLE) ×2
IMMOBILIZER KNEE 20 (SOFTGOODS) ×2
IMMOBILIZER KNEE 20 THIGH 36 (SOFTGOODS) ×1 IMPLANT
KIT BASIN OR (CUSTOM PROCEDURE TRAY) ×2 IMPLANT
MANIFOLD NEPTUNE II (INSTRUMENTS) ×2 IMPLANT
NS IRRIG 1000ML POUR BTL (IV SOLUTION) ×2 IMPLANT
PACK TOTAL JOINT (CUSTOM PROCEDURE TRAY) ×2 IMPLANT
PAD ABD 7.5X8 STRL (GAUZE/BANDAGES/DRESSINGS) ×2 IMPLANT
PADDING CAST COTTON 6X4 STRL (CAST SUPPLIES) ×6 IMPLANT
POSITIONER SURGICAL ARM (MISCELLANEOUS) ×2 IMPLANT
SET HNDPC FAN SPRY TIP SCT (DISPOSABLE) ×1 IMPLANT
SPONGE GAUZE 4X4 12PLY (GAUZE/BANDAGES/DRESSINGS) ×2 IMPLANT
STRIP CLOSURE SKIN 1/2X4 (GAUZE/BANDAGES/DRESSINGS) ×4 IMPLANT
SUCTION FRAZIER 12FR DISP (SUCTIONS) ×2 IMPLANT
SUT MNCRL AB 4-0 PS2 18 (SUTURE) ×2 IMPLANT
SUT PDS AB 1 CT1 27 (SUTURE) ×6 IMPLANT
SUT VIC AB 2-0 CT1 27 (SUTURE) ×6
SUT VIC AB 2-0 CT1 TAPERPNT 27 (SUTURE) ×3 IMPLANT
SUT VLOC 180 0 24IN GS25 (SUTURE) ×2 IMPLANT
TOWEL OR 17X26 10 PK STRL BLUE (TOWEL DISPOSABLE) ×4 IMPLANT
TRAY FOLEY CATH 14FRSI W/METER (CATHETERS) ×2 IMPLANT
WATER STERILE IRR 1500ML POUR (IV SOLUTION) ×2 IMPLANT
WRAP KNEE MAXI GEL POST OP (GAUZE/BANDAGES/DRESSINGS) ×3 IMPLANT

## 2012-01-12 NOTE — Anesthesia Preprocedure Evaluation (Addendum)
Anesthesia Evaluation  Patient identified by MRN, date of birth, ID band Patient awake    Reviewed: Allergy & Precautions, H&P , NPO status , Patient's Chart, lab work & pertinent test results  History of Anesthesia Complications (+) PONV  Airway Mallampati: II TM Distance: >3 FB Neck ROM: Full    Dental No notable dental hx.    Pulmonary shortness of breath,  breath sounds clear to auscultation  Pulmonary exam normal       Cardiovascular Exercise Tolerance: Good hypertension, Rhythm:Regular Rate:Normal     Neuro/Psych PSYCHIATRIC DISORDERS Depression negative neurological ROS     GI/Hepatic Neg liver ROS, GERD-  Medicated,  Endo/Other  negative endocrine ROS  Renal/GU negative Renal ROS  negative genitourinary   Musculoskeletal negative musculoskeletal ROS (+)   Abdominal (+) + obese,   Peds negative pediatric ROS (+)  Hematology negative hematology ROS (+)   Anesthesia Other Findings   Reproductive/Obstetrics negative OB ROS                           Anesthesia Physical Anesthesia Plan  ASA: II  Anesthesia Plan: Spinal   Post-op Pain Management:    Induction: Intravenous  Airway Management Planned:   Additional Equipment:   Intra-op Plan:   Post-operative Plan: Extubation in OR  Informed Consent: I have reviewed the patients History and Physical, chart, labs and discussed the procedure including the risks, benefits and alternatives for the proposed anesthesia with the patient or authorized representative who has indicated his/her understanding and acceptance.   Dental advisory given  Plan Discussed with: CRNA  Anesthesia Plan Comments: (Discussed risks/benefits of spinal including headache, backache, failure, bleeding, infection, and nerve damage. Patient consents to spinal. Questions answered. Coagulation studies and platelet count acceptable. )       Anesthesia  Quick Evaluation

## 2012-01-12 NOTE — Anesthesia Procedure Notes (Signed)
Spinal  Patient location during procedure: OR End time: 01/12/2012 1:05 PM Staffing CRNA/Resident: Enriqueta Shutter Performed by: resident/CRNA  Preanesthetic Checklist Completed: patient identified, site marked, surgical consent, pre-op evaluation, timeout performed, IV checked, risks and benefits discussed and monitors and equipment checked Spinal Block Patient position: sitting Prep: Betadine Patient monitoring: heart rate, continuous pulse ox and blood pressure Approach: midline Location: L3-4 Injection technique: single-shot Needle Needle type: Spinocan and Sprotte  Needle gauge: 24 G Needle length: 5 cm Assessment Sensory level: T6 Additional Notes Expiration date of kit checked and confirmed. Patient tolerated procedure well, without complications.

## 2012-01-12 NOTE — Op Note (Signed)
Pre-operative diagnosis- Osteoarthritis  Right knee(s)  Post-operative diagnosis- Osteoarthritis Right knee(s)  Procedure-  Right  Total Knee Arthroplasty  Surgeon- Gus Rankin. Marjorie Deprey, MD  Assistant- Dimitri Ped, PA-C   Anesthesia-  Spinal EBL-* No blood loss amount entered *  Drains Hemovac  Tourniquet time-  Total Tourniquet Time Documented: Thigh (Right) - 41 minutes   Complications- None  Condition-PACU - hemodynamically stable.   Brief Clinical Note  Brittany Moon is a 51 y.o. year old female with end stage OA of her right knee with progressively worsening pain and dysfunction. She has constant pain, with activity and at rest and significant functional deficits with difficulties even with ADLs. She has had extensive non-op management including analgesics, injections of cortisone and viscosupplements, and home exercise program, but remains in significant pain with significant dysfunction.Radiographs show bone on bone arthritis. She presents now for right Total Knee Arthroplasty.    Procedure in detail---   The patient is brought into the operating room and positioned supine on the operating table. After successful administration of  Spinal,   a tourniquet is placed high on the  Right thigh(s) and the lower extremity is prepped and draped in the usual sterile fashion. Time out is performed by the operating team and then the  Right lower extremity is wrapped in Esmarch, knee flexed and the tourniquet inflated to 300 mmHg.       A midline incision is made with a ten blade through the subcutaneous tissue to the level of the extensor mechanism. A fresh blade is used to make a medial parapatellar arthrotomy. Soft tissue over the proximal medial tibia is subperiosteally elevated to the joint line with a knife and into the semimembranosus bursa with a Cobb elevator. Soft tissue over the proximal lateral tibia is elevated with attention being paid to avoiding the patellar tendon on the tibial  tubercle. The patella is everted, knee flexed 90 degrees and the ACL and PCL are removed. Findings are bone on bone medial and patellofemoral with medial osteochondral defect.        The drill is used to create a starting hole in the distal femur and the canal is thoroughly irrigated with sterile saline to remove the fatty contents. The 5 degree Right  valgus alignment guide is placed into the femoral canal and the distal femoral cutting block is pinned to remove 10 mm off the distal femur. Resection is made with an oscillating saw.      The tibia is subluxed forward and the menisci are removed. The extramedullary alignment guide is placed referencing proximally at the medial aspect of the tibial tubercle and distally along the second metatarsal axis and tibial crest. The block is pinned to remove 2mm off the more deficient medial  side. Resection is made with an oscillating saw. Size 2.5is the most appropriate size for the tibia and the proximal tibia is prepared with the modular drill and keel punch for that size.      The femoral sizing guide is placed and size 3 is most appropriate. Rotation is marked off the epicondylar axis and confirmed by creating a rectangular flexion gap at 90 degrees. The size 3 cutting block is pinned in this rotation and the anterior, posterior and chamfer cuts are made with the oscillating saw. The intercondylar block is then placed and that cut is made.      Trial size 2.5 tibial component, trial size 3 posterior stabilized femur and a 10  mm posterior stabilized rotating  platform insert trial is placed. Full extension is achieved with excellent varus/valgus and anterior/posterior balance throughout full range of motion. The patella is everted and thickness measured to be 21  mm. Free hand resection is taken to 12 mm, a 35 template is placed, lug holes are drilled, trial patella is placed, and it tracks normally. Osteophytes are removed off the posterior femur with the trial in  place. All trials are removed and the cut bone surfaces prepared with pulsatile lavage. Cement is mixed and once ready for implantation, the size 2.5 tibial implant, size  3 posterior stabilized femoral component, and the size 35 patella are cemented in place and the patella is held with the clamp. The trial insert is placed and the knee held in full extension. All extruded cement is removed and once the cement is hard the permanent 10 mm posterior stabilized rotating platform insert is placed into the tibial tray.      The wound is copiously irrigated with saline solution and the extensor mechanism closed over a hemovac drain with #1 PDS suture. The tourniquet is released for a total tourniquet time of 41  minutes. Flexion against gravity is 135 degrees and the patella tracks normally. Subcutaneous tissue is closed with 2.0 vicryl and subcuticular with running 4.0 Monocryl. The catheter for the Marcaine pain pump is placed and the pump is initiated. The incision is cleaned and dried and steri-strips and a bulky sterile dressing are applied. The limb is placed into a knee immobilizer and the patient is awakened and transported to recovery in stable condition.      Please note that a surgical assistant was a medical necessity for this procedure in order to perform it in a safe and expeditious manner. Surgical assistant was necessary to retract the ligaments and vital neurovascular structures to prevent injury to them and also necessary for proper positioning of the limb to allow for anatomic placement of the prosthesis.   Gus Rankin Kenzie Flakes, MD    01/12/2012, 2:16 PM

## 2012-01-12 NOTE — Anesthesia Postprocedure Evaluation (Signed)
  Anesthesia Post-op Note  Patient: Brittany Moon  Procedure(s) Performed: Procedure(s) (LRB): TOTAL KNEE ARTHROPLASTY (Right)  Patient Location: PACU  Anesthesia Type: Spinal  Level of Consciousness: awake and alert   Airway and Oxygen Therapy: Patient Spontanous Breathing  Post-op Pain: mild  Post-op Assessment: Post-op Vital signs reviewed, Patient's Cardiovascular Status Stable, Respiratory Function Stable, Patent Airway and No signs of Nausea or vomiting  Post-op Vital Signs: stable  Complications: No apparent anesthesia complications. Spinal has regressed from T-12 to L-4. No complaints.

## 2012-01-12 NOTE — Transfer of Care (Signed)
Immediate Anesthesia Transfer of Care Note  Patient: Brittany Moon  Procedure(s) Performed: Procedure(s) (LRB): TOTAL KNEE ARTHROPLASTY (Right)  Patient Location: PACU  Anesthesia Type: Regional  Level of Consciousness: awake, alert  and oriented  Airway & Oxygen Therapy: Patient Spontanous Breathing and Patient connected to face mask oxygen  Post-op Assessment: Report given to PACU RN and Post -op Vital signs reviewed and stable  Post vital signs: Reviewed and stable  Complications: No apparent anesthesia complications

## 2012-01-12 NOTE — Interval H&P Note (Signed)
History and Physical Interval Note:  01/12/2012 12:56 PM  Brittany Moon  has presented today for surgery, with the diagnosis of Osteoarthritis of the Right Knee  The various methods of treatment have been discussed with the patient and family. After consideration of risks, benefits and other options for treatment, the patient has consented to  Procedure(s) (LRB): TOTAL KNEE ARTHROPLASTY (Right) as a surgical intervention .  The patient's history has been reviewed, patient examined, no change in status, stable for surgery.  I have reviewed the patient's chart and labs.  Questions were answered to the patient's satisfaction.     Loanne Drilling

## 2012-01-13 LAB — BASIC METABOLIC PANEL
Calcium: 8.8 mg/dL (ref 8.4–10.5)
GFR calc non Af Amer: 73 mL/min — ABNORMAL LOW (ref >90)
Glucose, Bld: 122 mg/dL — ABNORMAL HIGH (ref 70–99)
Sodium: 141 mEq/L (ref 135–145)

## 2012-01-13 LAB — CBC
Hemoglobin: 12.8 g/dL (ref 12.0–15.0)
MCH: 30.3 pg (ref 26.0–34.0)
MCHC: 33.4 g/dL (ref 30.0–36.0)
Platelets: 215 10*3/uL (ref 150–400)

## 2012-01-13 MED ORDER — NICOTINE 21 MG/24HR TD PT24
21.0000 mg | MEDICATED_PATCH | Freq: Every day | TRANSDERMAL | Status: DC
  Administered 2012-01-13 – 2012-01-15 (×3): 21 mg via TRANSDERMAL
  Filled 2012-01-13 (×3): qty 1

## 2012-01-13 MED ORDER — MORPHINE SULFATE 2 MG/ML IJ SOLN: 1.0000 mg | INTRAMUSCULAR | Status: DC | PRN

## 2012-01-13 NOTE — Progress Notes (Signed)
Physical Therapy Treatment Patient Details Name: Brittany Moon MRN: [id number] DOB: 1960-09-01 Today's Date: 01/13/2012 Time: 1320-1410 PT Time Calculation (min): 50 min  PT Assessment / Plan / Recommendation Comments on Treatment Session  Progressing slowly with mobility. Limited by pain.     Follow Up Recommendations  Home health PT    Barriers to Discharge        Equipment Recommendations  None recommended by PT    Recommendations for Other Services    Frequency 7X/week   Plan Discharge plan remains appropriate    Precautions / Restrictions Precautions Precautions: Knee Required Braces or Orthoses: Knee Immobilizer - Right Knee Immobilizer - Right: Discontinue once straight leg raise with < 10 degree lag Restrictions Weight Bearing Restrictions: No RLE Weight Bearing: Weight bearing as tolerated   Pertinent Vitals/Pain     Mobility  Bed Mobility Bed Mobility: Sit to Supine Sit to Supine: 4: Min assist;HOB flat;With rail Details for Bed Mobility Assistance: Assist for R LE onto bed.  Transfers Transfers: Stand to Sit;Sit to Stand Sit to Stand: 4: Min assist;With upper extremity assist;From chair/3-in-1;From toilet Stand to Sit: 4: Min assist;With upper extremity assist;To toilet;To bed Details for Transfer Assistance: VCs safety, technique, hand placement. Assist to rise, stabilize, control descent.  Ambulation/Gait Ambulation/Gait Assistance: 4: Min assist Ambulation Distance (Feet): 55 Feet Assistive device: Rolling walker Ambulation/Gait Assistance Details: VCs safety, sequence. Slow gait speed. Assist to stabilize.  Gait Pattern: Step-to pattern;Antalgic;Decreased stride length;Decreased step length - right;Decreased step length - left;Decreased stance time - right    Exercises Total Joint Exercises Ankle Circles/Pumps: AROM;Both;10 reps;Supine Quad Sets: AROM;Both;Strengthening;10 reps;Supine Short Arc Quad: AAROM;Strengthening;Right;10 reps;Supine Heel  Slides: AAROM;Strengthening;Right;10 reps;Supine Hip ABduction/ADduction: AROM;Strengthening;Right;Supine;10 reps Straight Leg Raises: AAROM;Strengthening;Right;10 reps;Supine   PT Diagnosis:    PT Problem List:   PT Treatment Interventions:     PT Goals Acute Rehab PT Goals Pt will go Sit to Supine/Side: with supervision PT Goal: Sit to Supine/Side - Progress: Progressing toward goal Pt will go Sit to Stand: with supervision PT Goal: Sit to Stand - Progress: Progressing toward goal Pt will Ambulate: 51 - 150 feet;with supervision;with least restrictive assistive device PT Goal: Ambulate - Progress: Progressing toward goal  Visit Information  Last PT Received On: 01/13/12 Assistance Needed: +1    Subjective Data  Subjective: "I need to go to the bathroom" Patient Stated Goal: Less pain. Home.   Cognition  Overall Cognitive Status: Appears within functional limits for tasks assessed/performed Arousal/Alertness: Awake/alert Orientation Level: Appears intact for tasks assessed Behavior During Session: [facility] for tasks performed    Balance     End of Session PT - End of Session Equipment Utilized During Treatment: Gait belt;Right knee immobilizer Activity Tolerance: Patient limited by pain Patient left: in bed;with call bell/phone within reach   GP     [name] [facility] 01/13/2012, 3:40 PM [phone]

## 2012-01-13 NOTE — Evaluation (Signed)
Physical Therapy Evaluation Patient Details Name: Brittany Moon MRN: [id number] DOB: 1960-09-25 Today's Date: 01/13/2012 Time: 0921-0943 PT Time Calculation (min): 22 min  PT Assessment / Plan / Recommendation Clinical Impression  51 yo female s/p RTKA. Mobilizing fairly well despite pain levels. Recommend HHPT.     PT Assessment  Patient needs continued PT services    Follow Up Recommendations  Home health PT    Barriers to Discharge        Equipment Recommendations  None recommended by PT    Recommendations for Other Services OT consult   Frequency 7X/week    Precautions / Restrictions Precautions Precautions: Knee Required Braces or Orthoses: Knee Immobilizer - Right Knee Immobilizer - Right: Discontinue once straight leg raise with < 10 degree lag Restrictions Weight Bearing Restrictions: No RLE Weight Bearing: Weight bearing as tolerated   Pertinent Vitals/Pain       Mobility  Bed Mobility Bed Mobility: Supine to Sit Supine to Sit: HOB elevated;With rails;4: Min assist Details for Bed Mobility Assistance: Increased time. VCs safety, techique, hand placement. Assist for R LE off bed. Utilized bed pad to assist with scooting, positioning.  Transfers Transfers: Sit to Stand;Stand to Sit Sit to Stand: 4: Min assist;From elevated surface;With upper extremity assist;From bed Stand to Sit: With upper extremity assist;With armrests;To chair/3-in-1;4: Min assist Details for Transfer Assistance: VCs safety, technique, hand placement. Assist to rise, stabilize, control descent. Ambulation/Gait Ambulation/Gait Assistance: 4: Min assist Ambulation Distance (Feet): 45 Feet Assistive device: Rolling walker Ambulation/Gait Assistance Details: VCs safety, technique, posture, sequence. Assist to stabilize throughout ambulation.  Gait Pattern: Step-to pattern;Decreased stance time - right;Antalgic;Decreased stride length;Decreased step length - right;Decreased step length - left      Exercises     PT Diagnosis: Difficulty walking;Abnormality of gait;Acute pain  PT Problem List: Decreased strength;Decreased range of motion;Decreased activity tolerance;Decreased mobility;Pain;Decreased knowledge of use of DME;Decreased knowledge of precautions PT Treatment Interventions: DME instruction;Gait training;Stair training;Functional mobility training;Therapeutic activities;Therapeutic exercise;Patient/family education   PT Goals Acute Rehab PT Goals PT Goal Formulation: With patient Time For Goal Achievement: 01/20/12 Potential to Achieve Goals: Good Pt will go Supine/Side to Sit: with supervision PT Goal: Supine/Side to Sit - Progress: Goal set today Pt will go Sit to Supine/Side: with supervision PT Goal: Sit to Supine/Side - Progress: Goal set today Pt will go Sit to Stand: with supervision PT Goal: Sit to Stand - Progress: Goal set today Pt will Ambulate: 51 - 150 feet;with supervision;with least restrictive assistive device PT Goal: Ambulate - Progress: Goal set today Pt will Go Up / Down Stairs: 6-9 stairs;with min assist;with least restrictive assistive device (8 steps) PT Goal: Up/Down Stairs - Progress: Goal set today Pt will Perform Home Exercise Program: with supervision, verbal cues required/provided PT Goal: Perform Home Exercise Program - Progress: Goal set today  Visit Information  Last PT Received On: 01/13/12 Assistance Needed: +1    Subjective Data  Subjective: "It's about a 12" (pain) Patient Stated Goal: Less pain. Home   Prior Functioning  Home Living Lives With: Spouse Available Help at Discharge: Family Type of Home: House Home Access: Stairs to enter [employer] of Steps: 8 Entrance Stairs-Rails: Left;Right Home Layout: Two level;Able to live on main level with bedroom/bathroom Home Adaptive Equipment: Walker - rolling;Straight cane;Bedside commode/3-in-1;Crutches Prior Function Level of Independence: Independent with  assistive device(s) Communication Communication: No difficulties    Cognition  Overall Cognitive Status: Appears within functional limits for tasks assessed/performed Arousal/Alertness: Awake/Moon Orientation Level: Appears  intact for tasks assessed Behavior During Session: [facility] for tasks performed    Extremity/Trunk Assessment Right Lower Extremity Assessment RLE ROM/Strength/Tone: Deficits;Unable to fully assess;Due to pain RLE ROM/Strength/Tone Deficits: SLR 2/5. Moves ankle well.  RLE Sensation: WFL - Light Touch Left Lower Extremity Assessment LLE ROM/Strength/Tone: WFL for tasks assessed LLE Sensation: WFL - Light Touch Trunk Assessment Trunk Assessment: Normal   Balance    End of Session PT - End of Session Equipment Utilized During Treatment: Gait belt;Right knee immobilizer Activity Tolerance: Patient limited by pain Patient left: in chair;with call bell/phone within reach CPM Right Knee CPM Right Knee: Off  GP     Brittany Moon [facility] 01/13/2012, 10:06 AM [phone]

## 2012-01-13 NOTE — Progress Notes (Signed)
Late entry: at 0100 hemovac call out while turning.

## 2012-01-13 NOTE — Progress Notes (Signed)
   Subjective: 1 Day Post-Op Procedure(s) (LRB): TOTAL KNEE ARTHROPLASTY (Right) Patient reports pain as moderate and severe last night.  Pain is a little better this morning. Patient seen in rounds with Dr. Lequita Halt. Patient is having problems with pain in the knee and thigh, requiring pain medications We will start therapy today.  Plan is to go Home after hospital stay.  Objective: Vital signs in last 24 hours: Temp:  [94.5 F (34.7 C)-98.8 F (37.1 C)] 98.8 F (37.1 C) (07/30 0981) Pulse Rate:  [55-84] 73  (07/30 0632) Resp:  [10-20] 16  (07/30 0632) BP: (106-138)/(49-77) 131/73 mmHg (07/30 0632) SpO2:  [96 %-100 %] 100 % (07/30 1914) Weight:  [95.255 kg (210 lb)] 95.255 kg (210 lb) (07/29 1614)  Intake/Output from previous day:  Intake/Output Summary (Last 24 hours) at 01/13/12 0823 Last data filed at 01/13/12 0553  Gross per 24 hour  Intake 4102.5 ml  Output   2615 ml  Net 1487.5 ml    Intake/Output this shift: UOP 875  Labs:  Basename 01/13/12 0400  HGB 12.8    Basename 01/13/12 0400  WBC 8.8  RBC 4.22  HCT 38.3  PLT 215    Basename 01/13/12 0400  NA 141  K 4.0  CL 107  CO2 26  BUN 11  CREATININE 0.89  GLUCOSE 122*  CALCIUM 8.8   No results found for this basename: LABPT:2,INR:2 in the last 72 hours  EXAM General - Patient is Alert, Appropriate and Oriented Extremity - Neurovascular intact Sensation intact distally Dorsiflexion/Plantar flexion intact Dressing - dressing C/D/I Motor Function - intact, moving foot and toes well on exam.  Hemovac pulled without difficulty.  Past Medical History  Diagnosis Date  . Depression   . GERD (gastroesophageal reflux disease)   . Obesity   . Arthritis   . Shingles 2 yrs ago    on face  . Shortness of breath 01-08-12    with exertion only.  . Adverse effect of other general anesthetics, sequela 01-08-12    prone to panic attacks after anesthesia, coming from under  . PONV (postoperative nausea and  vomiting) 01-08-12    many yrs ago  . Hypertension 01-08-12    past hx. elevated bp, no current bp meds    Assessment/Plan: 1 Day Post-Op Procedure(s) (LRB): TOTAL KNEE ARTHROPLASTY (Right) Principal Problem:  *OA (osteoarthritis) of knee   Advance diet Up with therapy Discharge home with home health  DVT Prophylaxis - Xarelto Weight-Bearing as tolerated to right leg No vaccines. D/C PCA Morphine, Change to IV push D/C O2 and Pulse OX and try on Room Air  Diamonds Lippard 01/13/2012, 8:23 AM

## 2012-01-13 NOTE — Progress Notes (Signed)
CSW consulted for SNF placement. PN notes reviewed. Pt plans to return home with [facility]. CSW is available to assist with d/c planning to [facility] if plan changes.   Cori Razor LCSW [phone]

## 2012-01-14 ENCOUNTER — Encounter (HOSPITAL_COMMUNITY): Payer: Self-pay | Admitting: Orthopedic Surgery

## 2012-01-14 LAB — BASIC METABOLIC PANEL
BUN: 6 mg/dL (ref 6–23)
Chloride: 101 mEq/L (ref 96–112)
Creatinine, Ser: 0.81 mg/dL (ref 0.50–1.10)
GFR calc non Af Amer: 81 mL/min — ABNORMAL LOW (ref >90)

## 2012-01-14 LAB — CBC
HCT: 36.4 % (ref 36.0–46.0)
MCH: 30.8 pg (ref 26.0–34.0)
MCV: 91 fL (ref 78.0–100.0)
Platelets: 206 10*3/uL (ref 150–400)
RDW: 13.6 % (ref 11.5–15.5)

## 2012-01-14 NOTE — Progress Notes (Signed)
Physical Therapy Treatment Patient Details Name: Brittany Moon MRN: [id number] DOB: 19-Apr-1961 Today's Date: 01/14/2012 Time: 1320-1400 PT Time Calculation (min): 40 min  PT Assessment / Plan / Recommendation Comments on Treatment Session       Follow Up Recommendations  Home health PT    Barriers to Discharge        Equipment Recommendations  None recommended by PT    Recommendations for Other Services    Frequency 7X/week   Plan Discharge plan remains appropriate    Precautions / Restrictions Precautions Precautions: Knee Required Braces or Orthoses: Knee Immobilizer - Right Knee Immobilizer - Right: Discontinue once straight leg raise with < 10 degree lag Restrictions Weight Bearing Restrictions: No RLE Weight Bearing: Weight bearing as tolerated       Mobility  Bed Mobility Bed Mobility: Supine to Sit;Sit to Supine Supine to Sit: 4: Min guard Sit to Supine: 4: Min assist Details for Bed Mobility Assistance: Assist for bil LEs onto bed. Pt able to get R LE off bed with use of UE.  Transfers Transfers: Sit to Stand;Stand to Sit Sit to Stand: 4: Min guard;With upper extremity assist;With armrests;From bed Stand to Sit: To bed;4: Min guard;With upper extremity assist;With armrests Details for Transfer Assistance: VCs safety, hand placement. Assist to rise, stabilize, control descent.  Ambulation/Gait Ambulation/Gait Assistance: 4: Min guard Ambulation Distance (Feet): 115 Feet Assistive device: Rolling walker Ambulation/Gait Assistance Details: Slow gait speed. VCs safety.  Gait Pattern: Step-to pattern;Antalgic;Decreased stride length;Decreased step length - right;Decreased step length - left    Exercises Total Joint Exercises Ankle Circles/Pumps: AROM;Both;10 reps;Supine Quad Sets: AROM;Both;10 reps;Supine Short Arc Quad: AAROM;Strengthening;Right;10 reps;Supine Heel Slides: AAROM;Strengthening;Right;10 reps;Supine Hip ABduction/ADduction:  AROM;Strengthening;Right;10 reps;Supine Straight Leg Raises: AAROM;Strengthening;Right;10 reps;Supine   PT Diagnosis:    PT Problem List:   PT Treatment Interventions:     PT Goals Acute Rehab PT Goals Pt will go Supine/Side to Sit: with supervision PT Goal: Supine/Side to Sit - Progress: Progressing toward goal Pt will go Sit to Supine/Side: with supervision PT Goal: Sit to Supine/Side - Progress: Progressing toward goal Pt will go Sit to Stand: with supervision PT Goal: Sit to Stand - Progress: Progressing toward goal Pt will Ambulate: 16 - 50 feet;with supervision;with least restrictive assistive device PT Goal: Ambulate - Progress: Progressing toward goal Pt will Perform Home Exercise Program: with supervision, verbal cues required/provided PT Goal: Perform Home Exercise Program - Progress: Progressing toward goal  Visit Information  Last PT Received On: 01/14/12 Assistance Needed: +1    Subjective Data  Subjective: "The pain is a little better" Patient Stated Goal: Less pain. Home   Cognition  Overall Cognitive Status: Appears within functional limits for tasks assessed/performed Arousal/Alertness: Awake/Moon Orientation Level: Appears intact for tasks assessed Behavior During Session: Brittany Moon for tasks performed    Balance     End of Session PT - End of Session Equipment Utilized During Treatment: Right knee immobilizer Activity Tolerance: Patient tolerated treatment well Patient left: in bed;with call bell/phone within reach   GP     Brittany Moon Brittany Moon 01/14/2012, 4:22 PM [phone]

## 2012-01-14 NOTE — Evaluation (Signed)
Occupational Therapy Evaluation Patient Details Name: Brittany Moon MRN: [id number] DOB: [birth year] Today's Date: 01/14/2012 Time: 7829-5621 OT Time Calculation (min): 20 min  OT Assessment / Plan / Recommendation Clinical Impression  Pt doing well POD 2 RTKR. Skilled OT indicated to maximize independence with BADLs to supervision level in prep for safe d/c home with HHOT.    OT Assessment  Patient needs continued OT Services    Follow Up Recommendations  Home health OT    Barriers to Discharge      Equipment Recommendations  None recommended by OT    Recommendations for Other Services    Frequency  Min 2X/week    Precautions / Restrictions Precautions Precautions: Knee Required Braces or Orthoses: Knee Immobilizer - Right Knee Immobilizer - Right: Discontinue once straight leg raise with < 10 degree lag Restrictions Weight Bearing Restrictions: No RLE Weight Bearing: Weight bearing as tolerated   Pertinent Vitals/Pain     ADL  Grooming: Performed;Wash/dry hands;Min guard Where Assessed - Grooming: Supported standing Upper Body Bathing: Simulated;Set up Where Assessed - Upper Body Bathing: Unsupported sitting Lower Body Bathing: Simulated;Minimal assistance Where Assessed - Lower Body Bathing: Supported sit to stand Upper Body Dressing: Simulated;Set up Where Assessed - Upper Body Dressing: Unsupported sitting Lower Body Dressing: Performed;Minimal assistance (to thread RUE into underwear.) Where Assessed - Lower Body Dressing: Supported sit to stand Toilet Transfer: Performed;Minimal Web designer: Regular height toilet;Grab bars Toileting - Architect and Hygiene: Performed;Minimal assistance Where Assessed - Engineer, mining and Hygiene: Sit to stand from 3-in-1 or toilet Equipment Used: Rolling walker Transfers/Ambulation Related to ADLs: Pt ambulated to the bathroom with minguard A and stated she would be  spongebathing until able to step into shower.    OT Diagnosis: Generalized weakness  OT Problem List: Decreased activity tolerance;Decreased knowledge of use of DME or AE;Pain OT Treatment Interventions: Self-care/ADL training;Therapeutic activities;DME and/or AE instruction;Patient/family education   OT Goals Acute Rehab OT Goals OT Goal Formulation: With patient Potential to Achieve Goals: Good ADL Goals Pt Will Perform Grooming: with supervision;Standing at sink ADL Goal: Grooming - Progress: Goal set today Pt Will Perform Lower Body Bathing: with supervision;Sit to stand from chair;Sit to stand from bed ADL Goal: Lower Body Bathing - Progress: Goal set today Pt Will Perform Lower Body Dressing: with supervision;Sit to stand from chair;Sit to stand from bed ADL Goal: Lower Body Dressing - Progress: Goal set today Pt Will Transfer to Toilet: with supervision;Comfort height toilet;3-in-1;Ambulation ADL Goal: Toilet Transfer - Progress: Goal set today Pt Will Perform Toileting - Clothing Manipulation: with supervision;Standing ADL Goal: Toileting - Clothing Manipulation - Progress: Goal set today Pt Will Perform Toileting - Hygiene: with supervision;Sit to stand from 3-in-1/toilet ADL Goal: Toileting - Hygiene - Progress: Goal set today  Visit Information  Last OT Received On: 01/14/12 Assistance Needed: +1    Subjective Data  Subjective: I really should get some underwear on. Patient Stated Goal: To return home with assistance from husband.   Prior Functioning  Vision/Perception  Home Living Lives With: Spouse Available Help at Discharge: Family Type of Home: House Home Access: Stairs to enter [employer] of Steps: 8 Entrance Stairs-Rails: Right;Left Home Layout: Two level;Able to live on main level with bedroom/bathroom Bathroom Shower/Tub: Tub/shower unit Bathroom Toilet: Handicapped height Home Adaptive Equipment: Grab bars in shower;Walker -  rolling;Bedside commode/3-in-1;Crutches Prior Function Level of Independence: Independent with assistive device(s) Driving: Yes Vocation: Unemployed Communication Communication: No difficulties  Cognition  Overall Cognitive Status: Appears within functional limits for tasks assessed/performed Arousal/Alertness: Awake/alert Orientation Level: Appears intact for tasks assessed Behavior During Session: [facility] for tasks performed    Extremity/Trunk Assessment Right Upper Extremity Assessment RUE ROM/Strength/Tone: [facility] for tasks assessed Left Upper Extremity Assessment LUE ROM/Strength/Tone: WFL for tasks assessed   Mobility Transfers Sit to Stand: 4: Min guard;4: Min assist;With upper extremity assist;From chair/3-in-1;From toilet;With armrests Stand to Sit: 4: Min guard;With upper extremity assist;With armrests;To chair/3-in-1;To toilet Details for Transfer Assistance: min VCs for hand placement and LE positon.   Exercise    Balance    End of Session OT - End of Session Activity Tolerance: Patient limited by pain Patient left: in chair;with call bell/phone within reach CPM Right Knee CPM Right Knee: Off  GO     Syanne Looney A OTR/L [id number] 01/14/2012, 9:41 AM

## 2012-01-14 NOTE — Progress Notes (Signed)
Physical Therapy Treatment Patient Details Name: Brittany Moon MRN: [id number] DOB: [birth year] Today's Date: 01/14/2012 Time: 3086-5784 PT Time Calculation (min): 28 min  PT Assessment / Plan / Recommendation Comments on Treatment Session  Continuing to progress well.     Follow Up Recommendations  Home health PT    Barriers to Discharge        Equipment Recommendations  None recommended by PT    Recommendations for Other Services    Frequency 7X/week   Plan Discharge plan remains appropriate    Precautions / Restrictions Precautions Precautions: Knee Required Braces or Orthoses: Knee Immobilizer - Right Knee Immobilizer - Right: Discontinue once straight leg raise with < 10 degree lag Restrictions Weight Bearing Restrictions: No RLE Weight Bearing: Weight bearing as tolerated   Pertinent Vitals/Pain 7/10 R knee    Mobility  Bed Mobility Bed Mobility: Not assessed Transfers Transfers: Sit to Stand;Stand to Sit Sit to Stand: 4: Min assist;With upper extremity assist;With armrests;From chair/3-in-1 Stand to Sit: 4: Min guard;With upper extremity assist;With armrests;To chair/3-in-1 Details for Transfer Assistance: VCs safety, hand placement. Assist to rise.  Ambulation/Gait Ambulation/Gait Assistance: 4: Min guard Ambulation Distance (Feet): 115 Feet Assistive device: Rolling walker Ambulation/Gait Assistance Details: VCs safety. Slow gait speed.  Gait Pattern: Step-to pattern;Antalgic;Decreased stride length;Decreased step length - right;Decreased step length - left    Exercises     PT Diagnosis:    PT Problem List:   PT Treatment Interventions:     PT Goals Acute Rehab PT Goals Pt will go Sit to Stand: with supervision PT Goal: Sit to Stand - Progress: Progressing toward goal Pt will Ambulate: 16 - 50 feet;with supervision;with least restrictive assistive device PT Goal: Ambulate - Progress: Progressing toward goal  Visit Information  Last PT Received On:  01/14/12 Assistance Needed: +1    Subjective Data  Subjective: "My husband wants me to go to a rehab" Patient Stated Goal: Less pain. Home   Cognition  Overall Cognitive Status: Appears within functional limits for tasks assessed/performed Arousal/Alertness: Awake/alert Orientation Level: Appears intact for tasks assessed Behavior During Session: [facility] for tasks performed    Balance     End of Session PT - End of Session Equipment Utilized During Treatment: Gait belt;Right knee immobilizer Activity Tolerance: Patient tolerated treatment well Patient left: in chair;with call bell/phone within reach CPM Right Knee CPM Right Knee: Off   GP     [name] [facility] 01/14/2012, 10:03 AM [phone]

## 2012-01-15 LAB — CBC
Hemoglobin: 11.2 g/dL — ABNORMAL LOW (ref 12.0–15.0)
MCH: 31 pg (ref 26.0–34.0)
RBC: 3.61 MIL/uL — ABNORMAL LOW (ref 3.87–5.11)

## 2012-01-15 MED ORDER — METHOCARBAMOL 500 MG PO TABS: 500.0000 mg | ORAL_TABLET | Freq: Four times a day (QID) | ORAL | Status: AC | PRN

## 2012-01-15 MED ORDER — RIVAROXABAN 10 MG PO TABS: 10.0000 mg | ORAL_TABLET | Freq: Every day | ORAL | Status: DC

## 2012-01-15 MED ORDER — OXYCODONE HCL 5 MG PO TABS: 5.0000 mg | ORAL_TABLET | ORAL | Status: AC | PRN

## 2012-01-15 NOTE — Progress Notes (Signed)
Discharged via w/c to [facility]. Assessment unchanged from am. 

## 2012-01-15 NOTE — Progress Notes (Signed)
  LATE ENTRY NOTE Date of Service of Visit - 01/14/2012  Subjective: 2 Days Post-Op Procedure(s) (LRB): TOTAL KNEE ARTHROPLASTY (Right) Patient reports pain as mild and moderate.   Patient seen in rounds for Dr. Lequita Halt. Patient is well, but has had some minor complaints of pain in the knee, requiring pain medications Plan is to go Home after hospital stay.  Objective: Vital signs in last 24 hours: Temp:  [98.2 F (36.8 C)-99.8 F (37.7 C)] 98.2 F (36.8 C) (08/01 9811) Pulse Rate:  [87-91] 91  (08/01 0638) Resp:  [16-20] 20  (08/01 0638) BP: (112-147)/(68-71) 147/71 mmHg (08/01 0638) SpO2:  [94 %-97 %] 95 % (08/01 9147)  Intake/Output from previous day:  Intake/Output Summary (Last 24 hours) at 01/15/12 1104 Last data filed at 01/15/12 0900  Gross per 24 hour  Intake    720 ml  Output      0 ml  Net    720 ml    Intake/Output this shift: Total I/O In: 240 [P.O.:240] Out: -   Labs:  Basename 01/15/12 0325 01/14/12 0439 01/13/12 0400  HGB 11.2* 12.3 12.8    Basename 01/15/12 0325 01/14/12 0439  WBC 14.0* 11.7*  RBC 3.61* 4.00  HCT 33.1* 36.4  PLT 248 206    Basename 01/14/12 0439 01/13/12 0400  NA 134* 141  K 4.2 4.0  CL 101 107  CO2 22 26  BUN 6 11  CREATININE 0.81 0.89  GLUCOSE 172* 122*  CALCIUM 8.7 8.8   No results found for this basename: LABPT:2,INR:2 in the last 72 hours  EXAM General - Patient is Alert, Appropriate and Oriented Extremity - Neurovascular intact Sensation intact distally Dorsiflexion/Plantar flexion intact Dressing/Incision - clean, dry, no drainage, healing Motor Function - intact, moving foot and toes well on exam.   Past Medical History  Diagnosis Date  . Depression   . GERD (gastroesophageal reflux disease)   . Obesity   . Arthritis   . Shingles 2 yrs ago    on face  . Shortness of breath 01-08-12    with exertion only.  . Adverse effect of other general anesthetics, sequela 01-08-12    prone to panic attacks after  anesthesia, coming from under  . PONV (postoperative nausea and vomiting) 01-08-12    many yrs ago  . Hypertension 01-08-12    past hx. elevated bp, no current bp meds    Assessment/Plan: 2 Days Post-Op Procedure(s) (LRB): TOTAL KNEE ARTHROPLASTY (Right) Principal Problem:  *OA (osteoarthritis) of knee   Up with therapy Plan for discharge tomorrow Discharge home with home health  DVT Prophylaxis - Xarelto Weight-Bearing as tolerated to right leg  Rudolph Dobler 01/15/2012, 11:04 AM

## 2012-01-15 NOTE — Progress Notes (Signed)
   Subjective: 3 Days Post-Op Procedure(s) (LRB): TOTAL KNEE ARTHROPLASTY (Right) Patient reports pain as mild.   Patient seen in rounds with Dr. Lequita Halt. Patient is well, and has had no acute complaints or problems Patient is ready to go home today.  Objective: Vital signs in last 24 hours: Temp:  [98.2 F (36.8 C)-99.8 F (37.7 C)] 98.2 F (36.8 C) (08/01 1610) Pulse Rate:  [87-91] 91  (08/01 0638) Resp:  [16-20] 20  (08/01 0638) BP: (112-147)/(68-71) 147/71 mmHg (08/01 0638) SpO2:  [94 %-97 %] 95 % (08/01 9604)  Intake/Output from previous day:  Intake/Output Summary (Last 24 hours) at 01/15/12 1106 Last data filed at 01/15/12 0900  Gross per 24 hour  Intake    720 ml  Output      0 ml  Net    720 ml    Intake/Output this shift: Total I/O In: 240 [P.O.:240] Out: -   Labs:  Basename 01/15/12 0325 01/14/12 0439 01/13/12 0400  HGB 11.2* 12.3 12.8    Basename 01/15/12 0325 01/14/12 0439  WBC 14.0* 11.7*  RBC 3.61* 4.00  HCT 33.1* 36.4  PLT 248 206    Basename 01/14/12 0439 01/13/12 0400  NA 134* 141  K 4.2 4.0  CL 101 107  CO2 22 26  BUN 6 11  CREATININE 0.81 0.89  GLUCOSE 172* 122*  CALCIUM 8.7 8.8   No results found for this basename: LABPT:2,INR:2 in the last 72 hours  EXAM: General - Patient is Alert, Appropriate and Oriented Extremity - Neurovascular intact Sensation intact distally Dorsiflexion/Plantar flexion intact Incision - clean, dry, no drainage, healing Motor Function - intact, moving foot and toes well on exam.   Assessment/Plan: 3 Days Post-Op Procedure(s) (LRB): TOTAL KNEE ARTHROPLASTY (Right) Procedure(s) (LRB): TOTAL KNEE ARTHROPLASTY (Right) Past Medical History  Diagnosis Date  . Depression   . GERD (gastroesophageal reflux disease)   . Obesity   . Arthritis   . Shingles 2 yrs ago    on face  . Shortness of breath 01-08-12    with exertion only.  . Adverse effect of other general anesthetics, sequela 01-08-12    prone  to panic attacks after anesthesia, coming from under  . PONV (postoperative nausea and vomiting) 01-08-12    many yrs ago  . Hypertension 01-08-12    past hx. elevated bp, no current bp meds   Principal Problem:  *OA (osteoarthritis) of knee   Discharge home with home health Diet - Cardiac diet Follow up - in 2 weeks Activity - WBAT Disposition - Home Condition Upon Discharge - Good D/C Meds - See DC Summary DVT Prophylaxis - Xarelto  Brittany Moon 01/15/2012, 11:06 AM

## 2012-01-15 NOTE — Care Management Note (Signed)
    Page 1 of 2   01/15/2012     5:55:07 PM   CARE MANAGEMENT NOTE 01/15/2012  Patient:  Brittany Moon, Brittany Moon   Account Number:  [id number]  Date Initiated:  01/15/2012  Documentation initiated by:  Colleen Can  Subjective/Objective Assessment:   DX OSTEOARTHRITIS RIGHT KNEE; TOTAL KNEE REPLACEMNT     Action/Plan:   cm SPOKE WITH PATIENT. pLANS ARE FOR PATIENT TO RETURN TO HER HOME IN [place] WHERE SHE WILL HAVE FAMILY AS CAREGIVERS. SHE ALREADY HAS RW, TOILET SEAT AND CRUTCHES. Percival Spanish FOR [facility] SERVICES   Anticipated DC Date:  01/15/2012   Anticipated DC Plan:  HOME W HOME HEALTH SERVICES  In-house referral  Clinical Social Worker      DC Associate Professor  CM consult      [facility] Choice  HOME HEALTH   Choice offered to / List presented to:  C-1 Patient   DME arranged  NA      DME agency  NA     HH arranged  HH-2 PT      [facility] agency  [facility]   Status of service:  Completed, signed off Medicare Important Message given?  NO (If response is "NO", the following Medicare IM given date fields will be blank) Date Medicare IM given:   Date Additional Medicare IM given:    Discharge Disposition:  HOME W HOME HEALTH SERVICES  Per UR Regulation:  Reviewed for med. necessity/level of care/duration of stay  If discussed at Long Length of Stay Meetings, dates discussed:    Comments:  01/15/2012 Raynelle Bring BSN CCM PT FOR DISCHARGE TO TODAY. [facility] WILL START SERVICES TOMORROW 01/16/2012

## 2012-01-15 NOTE — Progress Notes (Signed)
Physical Therapy Treatment Patient Details Name: LAKEIA BRADSHAW MRN: [id number] DOB: 07-07-1960 Today's Date: 01/15/2012 Time: 1100-1150 PT Time Calculation (min): 50 min  PT Assessment / Plan / Recommendation Comments on Treatment Session  Practiced steps. Completed all education. Issued TKA handout.Pt planning d/c home today.     Follow Up Recommendations  Home health PT    Barriers to Discharge        Equipment Recommendations  None recommended by PT    Recommendations for Other Services    Frequency 7X/week   Plan Discharge plan remains appropriate    Precautions / Restrictions Precautions Precautions: Knee Required Braces or Orthoses: Knee Immobilizer - Right Knee Immobilizer - Right: Discontinue once straight leg raise with < 10 degree lag Restrictions Weight Bearing Restrictions: No RLE Weight Bearing: Weight bearing as tolerated   Pertinent Vitals/Pain     Mobility  Bed Mobility Bed Mobility: Not assessed Transfers Transfers: Sit to Stand;Stand to Sit Sit to Stand: 4: Min guard;With upper extremity assist;From chair/3-in-1;With armrests Stand to Sit: 4: Min guard;With upper extremity assist;With armrests;To chair/3-in-1 Details for Transfer Assistance: VCs safety, technique, hand placement.  Ambulation/Gait Ambulation/Gait Assistance: 4: Min guard Ambulation Distance (Feet): 115 Feet Assistive device: Rolling walker Ambulation/Gait Assistance Details: VCs safety. slow gait speed.  Gait Pattern: Antalgic;Decreased stride length;Decreased step length - right;Decreased step length - left Stairs: Yes Stairs Assistance: 4: Min assist Stairs Assistance Details (indicate cue type and reason): VCs safety, technique, sequence. Assist to stabilize.  Stair Management Technique: Forwards;With crutches;One rail Left Number of Stairs: 4     Exercises Total Joint Exercises Ankle Circles/Pumps: AROM;Both;10 reps;Supine Quad Sets: AROM;Both;Strengthening;10  reps;Supine Short Arc Quad: AROM;Strengthening;Right;10 reps;Supine Heel Slides: AAROM;Strengthening;Right;10 reps;Supine Hip ABduction/ADduction: AROM;Strengthening;Right;10 reps;Supine Straight Leg Raises: AAROM;Strengthening;Right;10 reps;Supine   PT Diagnosis:    PT Problem List:   PT Treatment Interventions:     PT Goals Acute Rehab PT Goals Pt will go Sit to Stand: with supervision PT Goal: Sit to Stand - Progress: Progressing toward goal Pt will Ambulate: 16 - 50 feet;with supervision;with least restrictive assistive device PT Goal: Ambulate - Progress: Progressing toward goal Pt will Go Up / Down Stairs: 6-9 stairs;with min assist;with least restrictive assistive device PT Goal: Up/Down Stairs - Progress: Progressing toward goal Pt will Perform Home Exercise Program: with supervision, verbal cues required/provided PT Goal: Perform Home Exercise Program - Progress: Progressing toward goal  Visit Information  Last PT Received On: 01/15/12 Assistance Needed: +1    Subjective Data  Subjective: "I've been up trying to see what I can do for myslef.......I'm going home" Patient Stated Goal: Home   Cognition  Overall Cognitive Status: Appears within functional limits for tasks assessed/performed Arousal/Alertness: Awake/alert Orientation Level: Appears intact for tasks assessed Behavior During Session: [facility] for tasks performed    Balance     End of Session PT - End of Session Equipment Utilized During Treatment: Right knee immobilizer;Gait belt Activity Tolerance: Patient tolerated treatment well Patient left: in chair;with call bell/phone within reach   GP     [name] [facility] 01/15/2012, 12:25 PM [phone]

## 2012-01-15 NOTE — Progress Notes (Signed)
Occupational Therapy Treatment Patient Details Name: Brittany Moon MRN: [id number] DOB: November 27, 1960 Today's Date: 01/15/2012 Time: 8295-6213 OT Time Calculation (min): 18 min  OT Assessment / Plan / Recommendation Comments on Treatment Session Pt continuing to require max cues for safe manipulation of RW during ADLs. Pt also found ambulating around room unassisted without KI. Con't to recommend HHOT.    Follow Up Recommendations  Home health OT    Barriers to Discharge       Equipment Recommendations  None recommended by OT (Pt states she will be spongebathing at home.)    Recommendations for Other Services    Frequency Min 2X/week   Plan Discharge plan remains appropriate    Precautions / Restrictions Precautions Precautions: Knee Required Braces or Orthoses: Knee Immobilizer - Right Knee Immobilizer - Right: Discontinue post op day 2 Restrictions Weight Bearing Restrictions: No RLE Weight Bearing: Weight bearing as tolerated   Pertinent Vitals/Pain     ADL  Grooming: Performed;Wash/dry hands;Min guard Lower Body Dressing: Performed;Min guard Where Assessed - Lower Body Dressing: Supported sit to Pharmacist, hospital: Performed;Min Pension scheme manager Method: Sit to Barista: Regular height toilet;Grab bars Toileting - Clothing Manipulation and Hygiene: Performed;Min guard Where Assessed - Engineer, mining and Hygiene: Sit to stand from 3-in-1 or toilet Transfers/Ambulation Related to ADLs: Found pt ambulating to the bathroom after retrieving underwear from suitcase unassisted and with out KI. Edcated pt to call for assist when getting up. Pt continues to require max cues for safe manipulation of RW around the bathroom. Also advised pt use 3n1 over toilet when she gets home due to the fact that she had to use grab bars to self assist to stand.    OT Diagnosis:    OT Problem List:   OT Treatment Interventions:     OT Goals ADL  Goals ADL Goal: Grooming - Progress: Progressing toward goals ADL Goal: Lower Body Dressing - Progress: Progressing toward goals ADL Goal: Toilet Transfer - Progress: Progressing toward goals ADL Goal: Toileting - Clothing Manipulation - Progress: Progressing toward goals ADL Goal: Toileting - Hygiene - Progress: Progressing toward goals  Visit Information  Last OT Received On: 01/15/12 Assistance Needed: +1    Subjective Data  Subjective: I dont like having to bother the nurses when I want to get up.   Prior Functioning       Cognition  Overall Cognitive Status: Appears within functional limits for tasks assessed/performed Arousal/Alertness: Awake/alert Orientation Level: Appears intact for tasks assessed Behavior During Session: [facility] for tasks performed    Mobility Transfers Sit to Stand: 4: Min guard;With upper extremity assist;From toilet Stand to Sit: 4: Min guard;With upper extremity assist;With armrests;To chair/3-in-1 Details for Transfer Assistance: VCs for hand placement, controlling descent to toilet.   Exercises    Balance    End of Session OT - End of Session Activity Tolerance: Patient tolerated treatment well Patient left: in chair;with call bell/phone within reach Nurse Communication: Other (comment) (Pt's attempts to ambulate unassisted.) CPM Right Knee CPM Right Knee: Off  GO     Lamona Eimer A OTR/L [id number] 01/15/2012, 9:09 AM

## 2012-01-25 NOTE — Discharge Summary (Signed)
Physician Discharge Summary   Patient ID: Brittany Moon MRN: [id number] DOB/AGE: 07/11/1960 51 y.o.  Admit date: 01/12/2012 Discharge date: 01/15/2012  Primary Diagnosis: Osteoarthritis Right knee   Admission Diagnoses:  Past Medical History  Diagnosis Date  . Depression   . GERD (gastroesophageal reflux disease)   . Obesity   . Arthritis   . Shingles 2 yrs ago    on face  . Shortness of breath 01-08-12    with exertion only.  . Adverse effect of other general anesthetics, sequela 01-08-12    prone to panic attacks after anesthesia, coming from under  . PONV (postoperative nausea and vomiting) 01-08-12    many yrs ago  . Hypertension 01-08-12    past hx. elevated bp, no current bp meds   Discharge Diagnoses:   Principal Problem:  *OA (osteoarthritis) of knee  Procedure:  Procedure(s) (LRB): TOTAL KNEE ARTHROPLASTY (Right)   Consults: None  HPI: RAYLEY GAO is a 51 y.o. year old female with end stage OA of her right knee with progressively worsening pain and dysfunction. She has constant pain, with activity and at rest and significant functional deficits with difficulties even with ADLs. She has had extensive non-op management including analgesics, injections of cortisone and viscosupplements, and home exercise program, but remains in significant pain with significant dysfunction.Radiographs show bone on bone arthritis. She presents now for right Total Knee Arthroplasty.      Laboratory Data: Hospital Outpatient Visit on 01/08/2012  Component Date Value Range Status  . MRSA, PCR 01/08/2012 NEGATIVE  NEGATIVE Final  . Staphylococcus aureus 01/08/2012 NEGATIVE  NEGATIVE Final   Comment:                                 The Xpert SA Assay (FDA                          approved for NASAL specimens                          only), is one component of                          a comprehensive surveillance                          program.  It is not intended        to diagnose infection nor to                          guide or monitor treatment.  Marland Kitchen aPTT 01/08/2012 39* 24 - 37 seconds Final   Comment:                                 IF BASELINE aPTT IS ELEVATED,                          SUGGEST PATIENT RISK ASSESSMENT                          BE USED TO DETERMINE APPROPRIATE  ANTICOAGULANT THERAPY.  . WBC 01/08/2012 9.8  4.0 - 10.5 K/uL Final  . RBC 01/08/2012 5.08  3.87 - 5.11 MIL/uL Final  . Hemoglobin 01/08/2012 15.8* 12.0 - 15.0 g/dL Final  . HCT 16/03/9603 45.4  36.0 - 46.0 % Final  . MCV 01/08/2012 89.4  78.0 - 100.0 fL Final  . MCH 01/08/2012 31.1  26.0 - 34.0 pg Final  . MCHC 01/08/2012 34.8  30.0 - 36.0 g/dL Final  . RDW 54/02/8118 13.3  11.5 - 15.5 % Final  . Platelets 01/08/2012 244  150 - 400 K/uL Final  . Sodium 01/08/2012 135  135 - 145 mEq/L Final  . Potassium 01/08/2012 3.8  3.5 - 5.1 mEq/L Final  . Chloride 01/08/2012 101  96 - 112 mEq/L Final  . CO2 01/08/2012 26  19 - 32 mEq/L Final  . Glucose, Bld 01/08/2012 90  70 - 99 mg/dL Final  . BUN 14/78/2956 12  6 - 23 mg/dL Final  . Creatinine, Ser 01/08/2012 0.83  0.50 - 1.10 mg/dL Final  . Calcium 21/30/8657 9.9  8.4 - 10.5 mg/dL Final  . Total Protein 01/08/2012 7.3  6.0 - 8.3 g/dL Final  . Albumin 84/69/6295 3.8  3.5 - 5.2 g/dL Final  . AST 28/41/3244 25  0 - 37 U/L Final  . ALT 01/08/2012 26  0 - 35 U/L Final  . Alkaline Phosphatase 01/08/2012 91  39 - 117 U/L Final  . Total Bilirubin 01/08/2012 0.3  0.3 - 1.2 mg/dL Final  . GFR calc non Af Amer 01/08/2012 79* >90 mL/min Final  . GFR calc Af Amer 01/08/2012 >90  >90 mL/min Final   Comment:                                 The eGFR has been calculated                          using the CKD EPI equation.                          This calculation has not been                          validated in all clinical                          situations.                          eGFR's persistently                           <90 mL/min signify                          possible Chronic Kidney Disease.  Marland Kitchen Prothrombin Time 01/08/2012 12.6  11.6 - 15.2 seconds Final  . INR 01/08/2012 0.92  0.00 - 1.49 Final  . Color, Urine 01/08/2012 YELLOW  YELLOW Final  . APPearance 01/08/2012 CLOUDY* CLEAR Final  . Specific Gravity, Urine 01/08/2012 1.025  1.005 - 1.030 Final  . pH 01/08/2012 5.5  5.0 - 8.0 Final  . Glucose, UA 01/08/2012 NEGATIVE  NEGATIVE mg/dL Final  . Hgb urine dipstick 01/08/2012 NEGATIVE  NEGATIVE Final  . Bilirubin Urine 01/08/2012 NEGATIVE  NEGATIVE Final  . Ketones, ur 01/08/2012 NEGATIVE  NEGATIVE mg/dL Final  . Protein, ur 40/98/1191 NEGATIVE  NEGATIVE mg/dL Final  . Urobilinogen, UA 01/08/2012 0.2  0.0 - 1.0 mg/dL Final  . Nitrite 47/82/9562 NEGATIVE  NEGATIVE Final  . Leukocytes, UA 01/08/2012 TRACE* NEGATIVE Final  . Squamous Epithelial / LPF 01/08/2012 FEW* RARE Final  . WBC, UA 01/08/2012 0-2  <3 WBC/hpf Final  . RBC / HPF 01/08/2012 0-2  <3 RBC/hpf Final  . Bacteria, UA 01/08/2012 RARE  RARE Final   No results found for this basename: HGB:5 in the last 72 hours No results found for this basename: WBC:2,RBC:2,HCT:2,PLT:2 in the last 72 hours No results found for this basename: NA:2,K:2,CL:2,CO2:2,BUN:2,CREATININE:2,GLUCOSE:2,CALCIUM:2 in the last 72 hours No results found for this basename: LABPT:2,INR:2 in the last 72 hours  X-Rays:No results found.  EKG:No orders found for this or any previous visit.   Hospital Course: Patient was admitted to [facility] and taken to the OR and underwent the above state procedure without complications.  Patient tolerated the procedure well and was later transferred to the recovery room and then to the orthopaedic floor for postoperative care.  They were given PO and IV analgesics for pain control following their surgery.  They were given 24 hours of postoperative antibiotics and started on DVT prophylaxis in the form of Xarelto.    PT and OT were ordered for total joint protocol.  Discharge planning consulted to help with postop disposition and equipment needs.  Patient had a decent night on the evening of surgery and started to get up OOB with therapy on day one.  PCA Morphine was discontinued and they were weaned over to PO meds.  Hemovac drain was pulled without difficulty.  Continued to work with therapy into day two.  Dressing was changed on day two and the incision was healing well.  By day three, the patient had progressed with therapy and meeting their goals.  Incision was healing well.  Patient was seen in rounds and was ready to go home.   Discharge Medications: Prior to Admission medications   Medication Sig Start Date End Date Taking? Authorizing Provider  clonazePAM (KLONOPIN) 1 MG tablet Take 1 mg by mouth 3 (three) times daily as needed. anxiety   Yes Historical Provider, MD  pantoprazole (PROTONIX) 40 MG tablet Take 40 mg by mouth daily as needed. For heartburn   Yes Historical Provider, MD  fluticasone (FLONASE) 50 MCG/ACT nasal spray Place 2 sprays into the nose daily as needed. For congestion    Historical Provider, MD  methocarbamol (ROBAXIN) 500 MG tablet Take 1 tablet (500 mg total) by mouth every 6 (six) hours as needed. 01/15/12 01/25/12  Alexzandrew Perkins, PA  oxyCODONE (OXY IR/ROXICODONE) 5 MG immediate release tablet Take 1-2 tablets (5-10 mg total) by mouth every 4 (four) hours as needed for pain. 01/15/12 01/25/12  Alexzandrew Julien Girt, PA  rivaroxaban (XARELTO) 10 MG TABS tablet Take 1 tablet (10 mg total) by mouth daily with breakfast. Take Xarelto for two and a half more weeks, then discontinue Xarelto. 01/15/12   Alexzandrew Julien Girt, PA    Diet: Cardiac diet Activity:WBAT Follow-up:in 2 weeks Disposition - Home Discharged Condition: good   Discharge Orders    Future Orders Please Complete By Expires   Diet - low sodium heart healthy      Diet Carb Modified      Call MD / Call 911  Comments:   If you experience chest pain or shortness of breath, CALL 911 and be transported to the hospital emergency room.  If you develope a fever above 101 F, pus (Stcharles drainage) or increased drainage or redness at the wound, or calf pain, call your surgeon's office.   Discharge instructions      Comments:   Pick up stool softner and laxative for home. Do not submerge incision under water. May shower. Continue to use ice for pain and swelling from surgery.  Take Xarelto for two and a half more weeks, then discontinue Xarelto.   Constipation Prevention      Comments:   Drink plenty of fluids.  Prune juice may be helpful.  You may use a stool softener, such as Colace (over the counter) 100 mg twice a day.  Use MiraLax (over the counter) for constipation as needed.   Increase activity slowly as tolerated      Patient may shower      Comments:   You may shower without a dressing once there is no drainage.  Do not wash over the wound.  If drainage remains, do not shower until drainage stops.   Driving restrictions      Comments:   No driving until released by the physician.   Lifting restrictions      Comments:   No lifting until released by the physician.   TED hose      Comments:   Use stockings (TED hose) for 3 weeks on both leg(s).  You may remove them at night for sleeping.   Change dressing      Comments:   Change dressing daily with sterile 4 x 4 inch gauze dressing and apply TED hose. Do not submerge the incision under water.   Do not put a pillow under the knee. Place it under the heel.      Do not sit on low chairs, stoools or toilet seats, as it may be difficult to get up from low surfaces        Medication List  As of 01/25/2012  9:31 AM   STOP taking these medications         diclofenac sodium 1 % Gel      HYDROcodone-acetaminophen 10-325 MG per tablet         TAKE these medications         clonazePAM 1 MG tablet   Commonly known as: KLONOPIN   Take 1 mg by  mouth 3 (three) times daily as needed. anxiety      fluticasone 50 MCG/ACT nasal spray   Commonly known as: FLONASE   Place 2 sprays into the nose daily as needed. For congestion      methocarbamol 500 MG tablet   Commonly known as: ROBAXIN   Take 1 tablet (500 mg total) by mouth every 6 (six) hours as needed.      oxyCODONE 5 MG immediate release tablet   Commonly known as: Oxy IR/ROXICODONE   Take 1-2 tablets (5-10 mg total) by mouth every 4 (four) hours as needed for pain.      pantoprazole 40 MG tablet   Commonly known as: PROTONIX   Take 40 mg by mouth daily as needed. For heartburn      rivaroxaban 10 MG Tabs tablet   Commonly known as: XARELTO   Take 1 tablet (10 mg total) by mouth daily with breakfast. Take Xarelto for two and a half more weeks, then discontinue Xarelto.  Follow-up Information    Follow up with Loanne Drilling, MD. Schedule an appointment as soon as possible for a visit in 2 weeks.   Contact information:   [facility] [address], [address] [place] [address] [phone]          Signed: Patrica Duel 01/25/2012, 9:31 AM

## 2012-04-08 ENCOUNTER — Other Ambulatory Visit: Payer: Self-pay | Admitting: Orthopedic Surgery

## 2012-04-08 MED ORDER — DEXAMETHASONE SODIUM PHOSPHATE 10 MG/ML IJ SOLN: 10.0000 mg | Freq: Once | INTRAMUSCULAR | Status: DC

## 2012-04-08 MED ORDER — BUPIVACAINE 0.25 % ON-Q PUMP SINGLE CATH 300ML: 300.0000 mL | INJECTION | Status: DC

## 2012-04-08 NOTE — Progress Notes (Signed)
Preoperative surgical orders have been place into the Epic hospital system for Brittany Moon on 04/08/2012, 1:27 PM  by Patrica Duel for surgery on 04/26/12.  Preop Total Knee orders including Bupivacaine On-Q pump, IV Tylenol, and IV Decadron as long as there are no contraindications to the above medications. Avel Peace, PA-C

## 2012-04-13 ENCOUNTER — Encounter (HOSPITAL_COMMUNITY): Payer: Self-pay | Admitting: Pharmacy Technician

## 2012-04-20 NOTE — Patient Instructions (Addendum)
20 Brittany Moon  04/20/2012   Your procedure is scheduled on:  04/26/12  Monday  Surgery [id number]  Report to [facility] at  1005     AM.  Call this number if you have problems the morning of surgery: [phone]         Remember:   Do not eat food  Or drink :After Midnight.SUNDAY NIGHT   Take these medicines the morning of surgery with A SIP OF WATER:   May take KLONIPIN, ROBAXIN, , HYDROCODONE     Or use flonase    IF NEEDED   .  Contacts, dentures or partial plates can not be worn to surgery  Leave suitcase in the car. After surgery it may be brought to your room.  For patients admitted to the hospital, checkout time is 11:00 AM day of  discharge.             SPECIAL INSTRUCTIONS- SEE [place] PREPARING FOR SURGERY INSTRUCTION SHEET-     DO NOT WEAR JEWELRY, LOTIONS, POWDERS, OR PERFUMES.  WOMEN-- DO NOT SHAVE LEGS OR UNDERARMS FOR 12 HOURS BEFORE SHOWERS. MEN MAY SHAVE FACE.  Patients discharged the day of surgery will not be allowed to drive home. IF going home the day of surgery, you must have a driver and someone to stay with you for the first 24 hours  Name and phone number of your driver:  admission                                                                      Please read over the following fact sheets that you were given: MRSA Information, Incentive Spirometry Sheet, Blood Transfusion Sheet  Information                                                                                   [name]  PST [phone]  [phone]

## 2012-04-21 ENCOUNTER — Encounter (HOSPITAL_COMMUNITY): Payer: Self-pay

## 2012-04-21 ENCOUNTER — Ambulatory Visit (HOSPITAL_COMMUNITY)
Admission: RE | Admit: 2012-04-21 | Discharge: 2012-04-21 | Disposition: A | Discharge status: Home / Self Care | Payer: BC Managed Care – PPO | Source: Ambulatory Visit | Attending: Orthopedic Surgery | Admitting: Orthopedic Surgery

## 2012-04-21 ENCOUNTER — Encounter (HOSPITAL_COMMUNITY)
Admission: RE | Admit: 2012-04-21 | Discharge: 2012-04-21 | Disposition: A | Discharge status: Home / Self Care | Payer: BC Managed Care – PPO | Source: Ambulatory Visit | Attending: Orthopedic Surgery | Admitting: Orthopedic Surgery

## 2012-04-21 DIAGNOSIS — Z01811 Encounter for preprocedural respiratory examination: Secondary | ICD-10-CM | POA: Insufficient documentation

## 2012-04-21 DIAGNOSIS — F17200 Nicotine dependence, unspecified, uncomplicated: Secondary | ICD-10-CM | POA: Insufficient documentation

## 2012-04-21 DIAGNOSIS — Z01818 Encounter for other preprocedural examination: Secondary | ICD-10-CM | POA: Insufficient documentation

## 2012-04-21 DIAGNOSIS — M1710 Unilateral primary osteoarthritis, unspecified knee: Secondary | ICD-10-CM | POA: Insufficient documentation

## 2012-04-21 DIAGNOSIS — Z01812 Encounter for preprocedural laboratory examination: Secondary | ICD-10-CM | POA: Insufficient documentation

## 2012-04-21 HISTORY — DX: Pneumonia, unspecified organism: J18.9

## 2012-04-21 HISTORY — DX: Anxiety disorder, unspecified: F41.9

## 2012-04-21 LAB — COMPREHENSIVE METABOLIC PANEL
Albumin: 3.8 g/dL (ref 3.5–5.2)
BUN: 13 mg/dL (ref 6–23)
Creatinine, Ser: 0.77 mg/dL (ref 0.50–1.10)
Potassium: 3.8 mEq/L (ref 3.5–5.1)
Total Protein: 7.5 g/dL (ref 6.0–8.3)

## 2012-04-21 LAB — URINALYSIS, ROUTINE W REFLEX MICROSCOPIC
Nitrite: NEGATIVE
Specific Gravity, Urine: 1.031 — ABNORMAL HIGH (ref 1.005–1.030)
Urobilinogen, UA: 0.2 mg/dL (ref 0.0–1.0)

## 2012-04-21 LAB — APTT: aPTT: 40 seconds — ABNORMAL HIGH (ref 24–37)

## 2012-04-21 LAB — CBC
HCT: 44.6 % (ref 36.0–46.0)
MCHC: 34.3 g/dL (ref 30.0–36.0)
RDW: 14 % (ref 11.5–15.5)

## 2012-04-21 LAB — SURGICAL PCR SCREEN
MRSA, PCR: NEGATIVE
Staphylococcus aureus: NEGATIVE

## 2012-04-21 LAB — PROTIME-INR
INR: 1.01 (ref 0.00–1.49)
Prothrombin Time: 13.2 seconds (ref 11.6–15.2)

## 2012-04-21 NOTE — Progress Notes (Signed)
Faxed ua with micro to Dr Lequita Halt for review with confirmation

## 2012-04-21 NOTE — Progress Notes (Signed)
EKG 5/13   chart

## 2012-04-22 NOTE — Progress Notes (Signed)
Received fax from Dr Lequita Halt that Cipro was called in for patient and patient aware

## 2012-04-25 ENCOUNTER — Other Ambulatory Visit: Payer: Self-pay | Admitting: Orthopedic Surgery

## 2012-04-25 NOTE — H&P (Signed)
Brittany Moon  DOB: 03/26/1961 Married / Language: English / Race: [place] Female  Date of Admission:  04/26/12  Chief Complaint:  Left Knee Pain  History of Present Illness The patient is a 51 year old female who comes in for a preoperative History and Physical. The patient is scheduled for a left total knee arthroplasty to be performed by Dr. Frank V. Aluisio, MD at [facility] on 04/26/2012. The patient is a 51 year old female who presents today for follow up of their knee. The patient is being followed for their right knee and left knee pain. They are post injections with Amber. Symptoms reported today include: pain and swelling. The patient feels that they are doing poorly (Patient states that the injection helped for 1 week.). She is ready for her knee replacement. They both hurt. She says she gets some swelling at times. They have been treated conservatively in the past for the above stated problem and despite conservative measures, they continue to have progressive pain and severe functional limitations and dysfunction. They have failed non-operative management including home exercise, medications, and injections. It is felt that they would benefit from undergoing total joint replacement. Risks and benefits of the procedure have been discussed with the patient and they elect to proceed with surgery. There are no active contraindications to surgery such as ongoing infection or rapidly progressive neurological disease.   Problem List S/P Right total knee arthroplasty (V43.65) Osteoarthritis, knee (715.96)   Allergies Adhesive Tape *MEDICAL DEVICES*   Family History Father. Cancer, Heart disease, Non-Insulin Dependent Diabetes Mellitus. deceased age 76 due to heart disease Mother. Cancer, Cerebrovascular Accident. esophageal cancer 2001, CVA 2005   Social History Alcohol use. Formerly drank alcohol. Current work status. Unemployed. Children.  0. Marital status. Married. Exercise. Light. Tobacco use. Current every day smoker. Advance Directives. none Tobacco use. Current every day smoker, Smokes < 1 pack of cigarettes per day. Non drinker / no alcohol Use Post-Surgical Plans. home with husband   Medication History Hydrocodone-Acetaminophen (10-325MG Tablet, Oral) Active. Robaxin (500MG Tablet, 1 Oral PO Q 6-8HRS PRN PAIN, Taken starting 03/18/2012) Active. KlonoPIN (1MG Tablet, Oral three times daily) Active.   Past Surgical History Tonsillectomy Arthroscopic Knee Surgery - Both Cholecystectomy Salpingectomy; Unilateral. Right Side Exploratory Lap. for Ectopic Pregnancy Tubal Ligation Lumpectomy. Right Breast (Noncancerous) Total Knee Replacement - Right  Medical Hiatory Hypercholesterolemia Anxiety Disorder Gastroesophageal Reflux Disease   Review of Systems General:Not Present- Chills, Fever, Night Sweats, Fatigue, Weight Gain, Weight Loss and Memory Loss. Skin:Not Present- Hives, Itching, Rash, Eczema and Lesions. HEENT:Not Present- Tinnitus, Headache, Double Vision, Visual Loss, Hearing Loss and Dentures. Respiratory:Not Present- Shortness of breath with exertion, Shortness of breath at rest, Allergies, Coughing up blood and Chronic Cough. Cardiovascular:Not Present- Chest Pain, Racing/skipping heartbeats, Difficulty Breathing Lying Down, Murmur, Swelling and Palpitations. Gastrointestinal:Not Present- Bloody Stool, Heartburn, Abdominal Pain, Vomiting, Nausea, Constipation, Diarrhea, Difficulty Swallowing, Jaundice and Loss of appetitie. Female Genitourinary:Not Present- Blood in Urine, Urinary frequency, Weak urinary stream, Discharge, Flank Pain, Incontinence, Painful Urination, Urgency, Urinary Retention and Urinating at Night. Musculoskeletal:Present- Joint Pain. Not Present- Muscle Weakness, Muscle Pain, Joint Swelling, Back Pain, Morning Stiffness and Spasms. Neurological:Not  Present- Tremor, Dizziness, Blackout spells, Paralysis, Difficulty with balance and Weakness. Psychiatric:Not Present- Insomnia.   Vitals Weight: 202 lb Height: 65 in Body Surface Area: 2.05 m Body Mass Index: 33.61 kg/m Pulse: 72 (Regular) Resp.: 14 (Unlabored) BP: 138/78 (Sitting, Right Arm, Standard)  Physical Exam The physical exam findings are   as follows:   General Mental Status - Alert, cooperative and good historian. General Appearance- pleasant. Not in acute distress. Orientation- Oriented X3. Build & Nutrition- Well nourished and Well developed.   Head and Neck Head- normocephalic, atraumatic . Neck Global Assessment- supple. no bruit auscultated on the right and no bruit auscultated on the left.   Eye Pupil- Bilateral- Regular and Round. Motion- Bilateral- EOMI.   Chest and Lung Exam Auscultation: Breath sounds:- clear at anterior chest wall and - clear at posterior chest wall. Adventitious sounds:- No Adventitious sounds.   Cardiovascular Auscultation:Rhythm- Regular rate and rhythm. Heart Sounds- S1 WNL and S2 WNL. Murmurs & Other Heart Sounds:Auscultation of the heart reveals - No Murmurs.   Abdomen Palpation/Percussion:Tenderness- Abdomen is non-tender to palpation. Rigidity (guarding)- Abdomen is soft. Auscultation:Auscultation of the abdomen reveals - Bowel sounds normal.   Female Genitourinary  Not done, not pertinent to present illness  Musculoskeletal Lower Extremity Left Lower Extremity: Left Knee:Evaluation of related systems reveals - no rashes, ulcers or lesions of bilateral lower extremities and neurovascularly intact bilaterally. Inspection and Palpation:Tenderness- tender to palpation. Crepitus- mild. Effusion- mild. Sensation- intact to light touch. Other characteristics- no ecchymosis and no erythema. ROM: Flexion:PROM- 120 . Extension:PROM- 5 .   Assessment &  Plan Osteoarthritis, knee (715.96) Impression: Left Knee  S/P Right total knee arthroplasty (V43.65)  Note: Patient is for a left total knee replacement by Dr. Aluisio.  Plan is to go home.  Signed electronically by DREW L Jadiel Schmieder, PA-C  

## 2012-04-26 ENCOUNTER — Encounter (HOSPITAL_COMMUNITY): Payer: Self-pay | Admitting: *Deleted

## 2012-04-26 ENCOUNTER — Encounter (HOSPITAL_COMMUNITY)
Admission: RE | Disposition: A | Discharge status: Home Health | Payer: Self-pay | Source: Ambulatory Visit | Attending: Orthopedic Surgery

## 2012-04-26 ENCOUNTER — Inpatient Hospital Stay (HOSPITAL_COMMUNITY)
Admission: RE | Admit: 2012-04-26 | Discharge: 2012-04-28 | DRG: 209 | Disposition: A | Discharge status: Home Health | Payer: BC Managed Care – PPO | Source: Ambulatory Visit | Attending: Orthopedic Surgery | Admitting: Orthopedic Surgery

## 2012-04-26 ENCOUNTER — Encounter (HOSPITAL_COMMUNITY): Payer: Self-pay | Admitting: Orthopedic Surgery

## 2012-04-26 ENCOUNTER — Ambulatory Visit (HOSPITAL_COMMUNITY): Payer: BC Managed Care – PPO | Admitting: *Deleted

## 2012-04-26 DIAGNOSIS — F17200 Nicotine dependence, unspecified, uncomplicated: Secondary | ICD-10-CM | POA: Diagnosis present

## 2012-04-26 DIAGNOSIS — F3289 Other specified depressive episodes: Secondary | ICD-10-CM | POA: Diagnosis present

## 2012-04-26 DIAGNOSIS — M179 Osteoarthritis of knee, unspecified: Secondary | ICD-10-CM | POA: Diagnosis present

## 2012-04-26 DIAGNOSIS — E669 Obesity, unspecified: Secondary | ICD-10-CM | POA: Diagnosis present

## 2012-04-26 DIAGNOSIS — Z96659 Presence of unspecified artificial knee joint: Secondary | ICD-10-CM

## 2012-04-26 DIAGNOSIS — K219 Gastro-esophageal reflux disease without esophagitis: Secondary | ICD-10-CM | POA: Diagnosis present

## 2012-04-26 DIAGNOSIS — F411 Generalized anxiety disorder: Secondary | ICD-10-CM | POA: Diagnosis present

## 2012-04-26 DIAGNOSIS — M1710 Unilateral primary osteoarthritis, unspecified knee: Principal | ICD-10-CM | POA: Diagnosis present

## 2012-04-26 DIAGNOSIS — F329 Major depressive disorder, single episode, unspecified: Secondary | ICD-10-CM | POA: Diagnosis present

## 2012-04-26 HISTORY — PX: TOTAL KNEE ARTHROPLASTY: SHX125

## 2012-04-26 LAB — TYPE AND SCREEN: Antibody Screen: NEGATIVE

## 2012-04-26 SURGERY — ARTHROPLASTY, KNEE, TOTAL
Anesthesia: Spinal | Site: Knee | Laterality: Left | Wound class: Clean

## 2012-04-26 MED ORDER — DEXAMETHASONE 6 MG PO TABS
10.0000 mg | ORAL_TABLET | Freq: Once | ORAL | Status: DC
  Filled 2012-04-26: qty 1

## 2012-04-26 MED ORDER — CEFAZOLIN SODIUM-DEXTROSE 2-3 GM-% IV SOLR
2.0000 g | INTRAVENOUS | Status: AC
  Administered 2012-04-26: 2 g via INTRAVENOUS

## 2012-04-26 MED ORDER — DOCUSATE SODIUM 100 MG PO CAPS
100.0000 mg | ORAL_CAPSULE | Freq: Two times a day (BID) | ORAL | Status: DC
  Administered 2012-04-26 – 2012-04-28 (×4): 100 mg via ORAL

## 2012-04-26 MED ORDER — TRAMADOL HCL 50 MG PO TABS
50.0000 mg | ORAL_TABLET | Freq: Four times a day (QID) | ORAL | Status: DC | PRN
  Administered 2012-04-28: 100 mg via ORAL
  Filled 2012-04-26: qty 2

## 2012-04-26 MED ORDER — BUPIVACAINE ON-Q PAIN PUMP (FOR ORDER SET NO CHG)
INJECTION | Status: DC
  Filled 2012-04-26: qty 1

## 2012-04-26 MED ORDER — KCL IN DEXTROSE-NACL 20-5-0.9 MEQ/L-%-% IV SOLN
INTRAVENOUS | Status: DC
  Administered 2012-04-26 – 2012-04-27 (×2): via INTRAVENOUS
  Filled 2012-04-26 (×2): qty 1000

## 2012-04-26 MED ORDER — BISACODYL 10 MG RE SUPP: 10.0000 mg | Freq: Every day | RECTAL | Status: DC | PRN

## 2012-04-26 MED ORDER — FLEET ENEMA 7-19 GM/118ML RE ENEM: 1.0000 | ENEMA | Freq: Once | RECTAL | Status: AC | PRN

## 2012-04-26 MED ORDER — SODIUM CHLORIDE 0.9 % IJ SOLN: 9.0000 mL | INTRAMUSCULAR | Status: DC | PRN

## 2012-04-26 MED ORDER — SODIUM CHLORIDE 0.9 % IR SOLN
Status: DC | PRN
  Administered 2012-04-26: 1000 mL

## 2012-04-26 MED ORDER — RIVAROXABAN 10 MG PO TABS
10.0000 mg | ORAL_TABLET | Freq: Every day | ORAL | Status: DC
  Administered 2012-04-27 – 2012-04-28 (×2): 10 mg via ORAL
  Filled 2012-04-26 (×3): qty 1

## 2012-04-26 MED ORDER — PROMETHAZINE HCL 25 MG/ML IJ SOLN
6.2500 mg | INTRAMUSCULAR | Status: DC | PRN
  Administered 2012-04-26: 6.25 mg via INTRAVENOUS

## 2012-04-26 MED ORDER — ACETAMINOPHEN 10 MG/ML IV SOLN
INTRAVENOUS | Status: AC
  Filled 2012-04-26: qty 100

## 2012-04-26 MED ORDER — HYDROMORPHONE HCL PF 1 MG/ML IJ SOLN: 0.2500 mg | INTRAMUSCULAR | Status: DC | PRN

## 2012-04-26 MED ORDER — HYDROMORPHONE HCL PF 1 MG/ML IJ SOLN
0.5000 mg | INTRAMUSCULAR | Status: DC | PRN
  Administered 2012-04-26: 0.5 mg via INTRAVENOUS
  Administered 2012-04-27 (×2): 1 mg via INTRAVENOUS
  Filled 2012-04-26 (×3): qty 1

## 2012-04-26 MED ORDER — METOCLOPRAMIDE HCL 5 MG/ML IJ SOLN: 5.0000 mg | Freq: Three times a day (TID) | INTRAMUSCULAR | Status: DC | PRN

## 2012-04-26 MED ORDER — NALOXONE HCL 0.4 MG/ML IJ SOLN: 0.4000 mg | INTRAMUSCULAR | Status: DC | PRN

## 2012-04-26 MED ORDER — LACTATED RINGERS IV SOLN: INTRAVENOUS | Status: DC

## 2012-04-26 MED ORDER — FLUTICASONE PROPIONATE 50 MCG/ACT NA SUSP
2.0000 | Freq: Every day | NASAL | Status: DC | PRN
  Filled 2012-04-26: qty 16

## 2012-04-26 MED ORDER — HYDROMORPHONE HCL 2 MG PO TABS
2.0000 mg | ORAL_TABLET | ORAL | Status: DC | PRN
  Administered 2012-04-27: 2 mg via ORAL
  Filled 2012-04-26: qty 1

## 2012-04-26 MED ORDER — SODIUM CHLORIDE 0.9 % IV SOLN: INTRAVENOUS | Status: DC

## 2012-04-26 MED ORDER — POLYETHYLENE GLYCOL 3350 17 G PO PACK: 17.0000 g | PACK | Freq: Every day | ORAL | Status: DC | PRN

## 2012-04-26 MED ORDER — PHENOL 1.4 % MT LIQD
1.0000 | OROMUCOSAL | Status: DC | PRN
  Filled 2012-04-26: qty 177

## 2012-04-26 MED ORDER — 0.9 % SODIUM CHLORIDE (POUR BTL) OPTIME
TOPICAL | Status: DC | PRN
  Administered 2012-04-26: 1000 mL

## 2012-04-26 MED ORDER — LACTATED RINGERS IV SOLN
INTRAVENOUS | Status: DC | PRN
  Administered 2012-04-26 (×3): via INTRAVENOUS

## 2012-04-26 MED ORDER — DIPHENHYDRAMINE HCL 50 MG/ML IJ SOLN: 12.5000 mg | Freq: Four times a day (QID) | INTRAMUSCULAR | Status: DC | PRN

## 2012-04-26 MED ORDER — EPHEDRINE SULFATE 50 MG/ML IJ SOLN
INTRAMUSCULAR | Status: DC | PRN
  Administered 2012-04-26: 5 mg via INTRAVENOUS
  Administered 2012-04-26: 10 mg via INTRAVENOUS

## 2012-04-26 MED ORDER — BUPIVACAINE 0.25 % ON-Q PUMP SINGLE CATH 300ML
INJECTION | Status: AC
  Filled 2012-04-26: qty 300

## 2012-04-26 MED ORDER — HYDROMORPHONE 0.3 MG/ML IV SOLN
INTRAVENOUS | Status: DC
  Administered 2012-04-26: 5.19 mg via INTRAVENOUS
  Administered 2012-04-26 (×2): via INTRAVENOUS
  Administered 2012-04-27: 1.59 mg via INTRAVENOUS
  Administered 2012-04-27: 3.19 mg via INTRAVENOUS
  Administered 2012-04-27: 2.74 mg via INTRAVENOUS
  Administered 2012-04-27: 2.2 mg via INTRAVENOUS
  Administered 2012-04-27: 1.7 mg via INTRAVENOUS
  Administered 2012-04-27: 22:00:00 via INTRAVENOUS
  Administered 2012-04-27: 1.8 mg via INTRAVENOUS
  Administered 2012-04-28: 2.99 mg via INTRAVENOUS
  Administered 2012-04-28: 2.6 mg via INTRAVENOUS
  Filled 2012-04-26 (×4): qty 25

## 2012-04-26 MED ORDER — FENTANYL CITRATE 0.05 MG/ML IJ SOLN
INTRAMUSCULAR | Status: DC | PRN
  Administered 2012-04-26: 100 ug via INTRAVENOUS

## 2012-04-26 MED ORDER — DIPHENHYDRAMINE HCL 12.5 MG/5ML PO ELIX: 12.5000 mg | ORAL_SOLUTION | ORAL | Status: DC | PRN

## 2012-04-26 MED ORDER — MIDAZOLAM HCL 5 MG/5ML IJ SOLN
INTRAMUSCULAR | Status: DC | PRN
  Administered 2012-04-26: 2 mg via INTRAVENOUS

## 2012-04-26 MED ORDER — ONDANSETRON HCL 4 MG/2ML IJ SOLN: 4.0000 mg | Freq: Four times a day (QID) | INTRAMUSCULAR | Status: DC | PRN

## 2012-04-26 MED ORDER — MENTHOL 3 MG MT LOZG
1.0000 | LOZENGE | OROMUCOSAL | Status: DC | PRN
  Filled 2012-04-26: qty 9

## 2012-04-26 MED ORDER — ACETAMINOPHEN 10 MG/ML IV SOLN
1000.0000 mg | Freq: Four times a day (QID) | INTRAVENOUS | Status: DC
  Administered 2012-04-26 – 2012-04-27 (×3): 1000 mg via INTRAVENOUS
  Filled 2012-04-26 (×6): qty 100

## 2012-04-26 MED ORDER — ONDANSETRON HCL 4 MG/2ML IJ SOLN
INTRAMUSCULAR | Status: DC | PRN
  Administered 2012-04-26: 4 mg via INTRAVENOUS

## 2012-04-26 MED ORDER — METHOCARBAMOL 100 MG/ML IJ SOLN
500.0000 mg | Freq: Four times a day (QID) | INTRAMUSCULAR | Status: DC | PRN
  Administered 2012-04-26: 500 mg via INTRAVENOUS
  Filled 2012-04-26 (×2): qty 5

## 2012-04-26 MED ORDER — CEFAZOLIN SODIUM 1-5 GM-% IV SOLN
1.0000 g | Freq: Four times a day (QID) | INTRAVENOUS | Status: AC
  Administered 2012-04-26 – 2012-04-27 (×2): 1 g via INTRAVENOUS
  Filled 2012-04-26 (×2): qty 50

## 2012-04-26 MED ORDER — ACETAMINOPHEN 325 MG PO TABS: 650.0000 mg | ORAL_TABLET | Freq: Four times a day (QID) | ORAL | Status: DC | PRN

## 2012-04-26 MED ORDER — BUPIVACAINE 0.25 % ON-Q PUMP SINGLE CATH 300ML
INJECTION | Status: DC | PRN
  Administered 2012-04-26: 300 mL

## 2012-04-26 MED ORDER — PROMETHAZINE HCL 25 MG/ML IJ SOLN
INTRAMUSCULAR | Status: AC
  Filled 2012-04-26: qty 1

## 2012-04-26 MED ORDER — KCL IN DEXTROSE-NACL 20-5-0.9 MEQ/L-%-% IV SOLN
INTRAVENOUS | Status: AC
  Filled 2012-04-26: qty 1000

## 2012-04-26 MED ORDER — CLONAZEPAM 1 MG PO TABS
1.0000 mg | ORAL_TABLET | Freq: Three times a day (TID) | ORAL | Status: DC | PRN
  Administered 2012-04-27 (×2): 1 mg via ORAL
  Filled 2012-04-26 (×2): qty 1

## 2012-04-26 MED ORDER — PROPOFOL 10 MG/ML IV EMUL
INTRAVENOUS | Status: DC | PRN
  Administered 2012-04-26: 75 ug/kg/min via INTRAVENOUS

## 2012-04-26 MED ORDER — METHOCARBAMOL 500 MG PO TABS
500.0000 mg | ORAL_TABLET | Freq: Four times a day (QID) | ORAL | Status: DC | PRN
  Administered 2012-04-27 (×2): 500 mg via ORAL
  Filled 2012-04-26 (×2): qty 1

## 2012-04-26 MED ORDER — DIPHENHYDRAMINE HCL 12.5 MG/5ML PO ELIX: 12.5000 mg | ORAL_SOLUTION | Freq: Four times a day (QID) | ORAL | Status: DC | PRN

## 2012-04-26 MED ORDER — ACETAMINOPHEN 10 MG/ML IV SOLN
1000.0000 mg | Freq: Once | INTRAVENOUS | Status: AC
  Administered 2012-04-26: 1000 mg via INTRAVENOUS

## 2012-04-26 MED ORDER — METOCLOPRAMIDE HCL 10 MG PO TABS: 5.0000 mg | ORAL_TABLET | Freq: Three times a day (TID) | ORAL | Status: DC | PRN

## 2012-04-26 MED ORDER — DEXAMETHASONE 4 MG PO TABS
10.0000 mg | ORAL_TABLET | Freq: Once | ORAL | Status: DC
  Filled 2012-04-26: qty 2.5

## 2012-04-26 MED ORDER — DEXAMETHASONE SODIUM PHOSPHATE 10 MG/ML IJ SOLN
10.0000 mg | Freq: Once | INTRAMUSCULAR | Status: AC
  Administered 2012-04-27: 10 mg via INTRAVENOUS
  Filled 2012-04-26: qty 1

## 2012-04-26 MED ORDER — ONDANSETRON HCL 4 MG PO TABS: 4.0000 mg | ORAL_TABLET | Freq: Four times a day (QID) | ORAL | Status: DC | PRN

## 2012-04-26 MED ORDER — ACETAMINOPHEN 650 MG RE SUPP: 650.0000 mg | Freq: Four times a day (QID) | RECTAL | Status: DC | PRN

## 2012-04-26 MED ORDER — CEFAZOLIN SODIUM-DEXTROSE 2-3 GM-% IV SOLR
INTRAVENOUS | Status: AC
  Filled 2012-04-26: qty 50

## 2012-04-26 SURGICAL SUPPLY — 55 items
BAG SPEC THK2 15X12 ZIP CLS (MISCELLANEOUS) ×1
BAG ZIPLOCK 12X15 (MISCELLANEOUS) ×2 IMPLANT
BANDAGE ELASTIC 6 VELCRO ST LF (GAUZE/BANDAGES/DRESSINGS) ×2 IMPLANT
BANDAGE ESMARK 6X9 LF (GAUZE/BANDAGES/DRESSINGS) ×1 IMPLANT
BLADE SAG 18X100X1.27 (BLADE) ×2 IMPLANT
BLADE SAW SGTL 11.0X1.19X90.0M (BLADE) ×2 IMPLANT
BNDG CMPR 9X6 STRL LF SNTH (GAUZE/BANDAGES/DRESSINGS) ×1
BNDG ESMARK 6X9 LF (GAUZE/BANDAGES/DRESSINGS) ×2
BOWL SMART MIX CTS (DISPOSABLE) ×2 IMPLANT
CATH KIT ON-Q SILVERSOAK 5 (CATHETERS) ×1 IMPLANT
CATH KIT ON-Q SILVERSOAK 5IN (CATHETERS) ×2 IMPLANT
CEMENT HV SMART SET (Cement) ×4 IMPLANT
CLOTH BEACON ORANGE TIMEOUT ST (SAFETY) ×2 IMPLANT
CUFF TOURN SGL QUICK 34 (TOURNIQUET CUFF) ×2
CUFF TRNQT CYL 34X4X40X1 (TOURNIQUET CUFF) ×1 IMPLANT
DRAPE EXTREMITY T 121X128X90 (DRAPE) ×2 IMPLANT
DRAPE POUCH INSTRU U-SHP 10X18 (DRAPES) ×2 IMPLANT
DRAPE U-SHAPE 47X51 STRL (DRAPES) ×2 IMPLANT
DRSG ADAPTIC 3X8 NADH LF (GAUZE/BANDAGES/DRESSINGS) ×2 IMPLANT
DRSG EMULSION OIL 3X16 NADH (GAUZE/BANDAGES/DRESSINGS) ×1 IMPLANT
DRSG PAD ABDOMINAL 8X10 ST (GAUZE/BANDAGES/DRESSINGS) ×1 IMPLANT
DURAPREP 26ML APPLICATOR (WOUND CARE) ×2 IMPLANT
ELECT REM PT RETURN 9FT ADLT (ELECTROSURGICAL) ×2
ELECTRODE REM PT RTRN 9FT ADLT (ELECTROSURGICAL) ×1 IMPLANT
EVACUATOR 1/8 PVC DRAIN (DRAIN) ×2 IMPLANT
FACESHIELD LNG OPTICON STERILE (SAFETY) ×10 IMPLANT
GLOVE BIO SURGEON STRL SZ8 (GLOVE) ×2 IMPLANT
GLOVE BIOGEL PI IND STRL 8 (GLOVE) ×2 IMPLANT
GLOVE BIOGEL PI INDICATOR 8 (GLOVE) ×2
GLOVE ECLIPSE 8.0 STRL XLNG CF (GLOVE) ×2 IMPLANT
GLOVE SURG SS PI 6.5 STRL IVOR (GLOVE) ×4 IMPLANT
GOWN STRL NON-REIN LRG LVL3 (GOWN DISPOSABLE) ×4 IMPLANT
GOWN STRL REIN XL XLG (GOWN DISPOSABLE) ×2 IMPLANT
HANDPIECE INTERPULSE COAX TIP (DISPOSABLE) ×2
IMMOBILIZER KNEE 20 (SOFTGOODS) ×2
IMMOBILIZER KNEE 20 THIGH 36 (SOFTGOODS) ×1 IMPLANT
KIT BASIN OR (CUSTOM PROCEDURE TRAY) ×2 IMPLANT
MANIFOLD NEPTUNE II (INSTRUMENTS) ×2 IMPLANT
NS IRRIG 1000ML POUR BTL (IV SOLUTION) ×2 IMPLANT
PACK TOTAL JOINT (CUSTOM PROCEDURE TRAY) ×2 IMPLANT
PAD ABD 7.5X8 STRL (GAUZE/BANDAGES/DRESSINGS) ×2 IMPLANT
PADDING CAST COTTON 6X4 STRL (CAST SUPPLIES) ×4 IMPLANT
POSITIONER SURGICAL ARM (MISCELLANEOUS) ×2 IMPLANT
SET HNDPC FAN SPRY TIP SCT (DISPOSABLE) ×1 IMPLANT
SPONGE GAUZE 4X4 12PLY (GAUZE/BANDAGES/DRESSINGS) ×2 IMPLANT
STRIP CLOSURE SKIN 1/2X4 (GAUZE/BANDAGES/DRESSINGS) ×4 IMPLANT
SUCTION FRAZIER 12FR DISP (SUCTIONS) ×2 IMPLANT
SUT MNCRL AB 4-0 PS2 18 (SUTURE) ×2 IMPLANT
SUT VIC AB 2-0 CT1 27 (SUTURE) ×6
SUT VIC AB 2-0 CT1 TAPERPNT 27 (SUTURE) ×3 IMPLANT
SUT VLOC 180 0 24IN GS25 (SUTURE) ×2 IMPLANT
TOWEL OR 17X26 10 PK STRL BLUE (TOWEL DISPOSABLE) ×4 IMPLANT
TRAY FOLEY CATH 14FRSI W/METER (CATHETERS) ×2 IMPLANT
WATER STERILE IRR 1500ML POUR (IV SOLUTION) ×3 IMPLANT
WRAP KNEE MAXI GEL POST OP (GAUZE/BANDAGES/DRESSINGS) ×4 IMPLANT

## 2012-04-26 NOTE — Interval H&P Note (Signed)
History and Physical Interval Note:  04/26/2012 10:36 AM  Brittany Moon  has presented today for surgery, with the diagnosis of osteoarthritis left knee  The various methods of treatment have been discussed with the patient and family. After consideration of risks, benefits and other options for treatment, the patient has consented to  Procedure(s) (LRB) with comments: TOTAL KNEE ARTHROPLASTY (Left) as a surgical intervention .  The patient's history has been reviewed, patient examined, no change in status, stable for surgery.  I have reviewed the patient's chart and labs.  Questions were answered to the patient's satisfaction.     Loanne Drilling

## 2012-04-26 NOTE — Op Note (Signed)
Pre-operative diagnosis- Osteoarthritis  Left knee(s)  Post-operative diagnosis- Osteoarthritis Left knee(s)  Procedure-  Left  Total Knee Arthroplasty  Surgeon- Brittany Moon. Brittany Cahoon, MD  Assistant- Brittany Able, PA-C   Anesthesia-  Spinal EBL-* No blood loss amount entered *  Drains Hemovac  Tourniquet time-  Total Tourniquet Time Documented: Thigh (Left) - 30 minutes   Complications- None  Condition-PACU - hemodynamically stable.   Brief Clinical Note  Brittany Moon is a 51 y.o. year old female with end stage OA of her left knee with progressively worsening pain and dysfunction. She has constant pain, with activity and at rest and significant functional deficits with difficulties even with ADLs. She has had extensive non-op management including analgesics, injections of cortisone and viscosupplements, and home exercise program, but remains in significant pain with significant dysfunction. Radiographs show joint space narrowing medial and patellofemoral and arthroscopy showed exposed bone in these areas. She presents now for left Total Knee Arthroplasty.    Procedure in detail---   The patient is brought into the operating room and positioned supine on the operating table. After successful administration of  Spinal,   a tourniquet is placed high on the  Left thigh(s) and the lower extremity is prepped and draped in the usual sterile fashion. Time out is performed by the operating team and then the  Left lower extremity is wrapped in Esmarch, knee flexed and the tourniquet inflated to 300 mmHg.       A midline incision is made with a ten blade through the subcutaneous tissue to the level of the extensor mechanism. A fresh blade is used to make a medial parapatellar arthrotomy. Soft tissue over the proximal medial tibia is subperiosteally elevated to the joint line with a knife and into the semimembranosus bursa with a Cobb elevator. Soft tissue over the proximal lateral tibia is elevated with  attention being paid to avoiding the patellar tendon on the tibial tubercle. The patella is everted, knee flexed 90 degrees and the ACL and PCL are removed. Findings are exposed bone medial and patellofemoral compartments with large patellar osteophytes.        The drill is used to create a starting hole in the distal femur and the canal is thoroughly irrigated with sterile saline to remove the fatty contents. The 5 degree Left  valgus alignment guide is placed into the femoral canal and the distal femoral cutting block is pinned to remove 10 mm off the distal femur. Resection is made with an oscillating saw.      The tibia is subluxed forward and the menisci are removed. The extramedullary alignment guide is placed referencing proximally at the medial aspect of the tibial tubercle and distally along the second metatarsal axis and tibial crest. The block is pinned to remove 2mm off the more deficient medial  side. Resection is made with an oscillating saw. Size 3is the most appropriate size for the tibia and the proximal tibia is prepared with the modular drill and keel punch for that size.      The femoral sizing guide is placed and size 3 is most appropriate. Rotation is marked off the epicondylar axis and confirmed by creating a rectangular flexion gap at 90 degrees. The size 3 cutting block is pinned in this rotation and the anterior, posterior and chamfer cuts are made with the oscillating saw. The intercondylar block is then placed and that cut is made.      Trial size 3 tibial component, trial size 3  posterior stabilized femur and a 10  mm posterior stabilized rotating platform insert trial is placed. Full extension is achieved with excellent varus/valgus and anterior/posterior balance throughout full range of motion. The patella is everted and thickness measured to be 22  mm. Free hand resection is taken to 12 mm, a 35 template is placed, lug holes are drilled, trial patella is placed, and it tracks  normally. Osteophytes are removed off the posterior femur with the trial in place. All trials are removed and the cut bone surfaces prepared with pulsatile lavage. Cement is mixed and once ready for implantation, the size 3 tibial implant, size  3 posterior stabilized femoral component, and the size 35 patella are cemented in place and the patella is held with the clamp. The trial insert is placed and the knee held in full extension. All extruded cement is removed and once the cement is hard the permanent 10 mm posterior stabilized rotating platform insert is placed into the tibial tray.      The wound is copiously irrigated with saline solution and the extensor mechanism closed over a hemovac drain with #1 PDS suture. The tourniquet is released for a total tourniquet time of 30  minutes. Flexion against gravity is 140 degrees and the patella tracks normally. Subcutaneous tissue is closed with 2.0 vicryl and subcuticular with running 4.0 Monocryl. The catheter for the Marcaine pain pump is placed and the pump is initiated. The incision is cleaned and dried and steri-strips and a bulky sterile dressing are applied. The limb is placed into a knee immobilizer and the patient is awakened and transported to recovery in stable condition.      Please note that a surgical assistant was a medical necessity for this procedure in order to perform it in a safe and expeditious manner. Surgical assistant was necessary to retract the ligaments and vital neurovascular structures to prevent injury to them and also necessary for proper positioning of the limb to allow for anatomic placement of the prosthesis.   Brittany Moon Brittany Vicencio, MD    04/26/2012, 12:43 PM

## 2012-04-26 NOTE — Transfer of Care (Signed)
Immediate Anesthesia Transfer of Care Note  Patient: Brittany Moon  Procedure(s) Performed: Procedure(s) (LRB) with comments: TOTAL KNEE ARTHROPLASTY (Left)  Patient Location: PACU  Anesthesia Type:Regional  Level of Consciousness: awake, alert  and oriented  Airway & Oxygen Therapy: Patient Spontanous Breathing and Patient connected to face mask oxygen  Post-op Assessment: Report given to PACU RN and Post -op Vital signs reviewed and stable  Post vital signs: Reviewed and stable  Complications: No apparent anesthesia complications

## 2012-04-26 NOTE — Progress Notes (Signed)
Utilization review completed.  

## 2012-04-26 NOTE — H&P (View-Only) (Signed)
Brittany Moon  DOB: 10/22/1960 Married / Language: English / Race: [place] Female  Date of Admission:  04/26/12  Chief Complaint:  Left Knee Pain  History of Present Illness The patient is a 51 year old female who comes in for a preoperative History and Physical. The patient is scheduled for a left total knee arthroplasty to be performed by Dr. Gus Rankin. Aluisio, MD at [facility] on 04/26/2012. The patient is a 51 year old female who presents today for follow up of their knee. The patient is being followed for their right knee and left knee pain. They are post injections with Amber. Symptoms reported today include: pain and swelling. The patient feels that they are doing poorly (Patient states that the injection helped for 1 week.). She is ready for her knee replacement. They both hurt. She says she gets some swelling at times. They have been treated conservatively in the past for the above stated problem and despite conservative measures, they continue to have progressive pain and severe functional limitations and dysfunction. They have failed non-operative management including home exercise, medications, and injections. It is felt that they would benefit from undergoing total joint replacement. Risks and benefits of the procedure have been discussed with the patient and they elect to proceed with surgery. There are no active contraindications to surgery such as ongoing infection or rapidly progressive neurological disease.   Problem List S/P Right total knee arthroplasty (V43.65) Osteoarthritis, knee (715.96)   Allergies Adhesive Tape *MEDICAL DEVICES*   Family History Father. Cancer, Heart disease, Non-Insulin Dependent Diabetes Mellitus. deceased age 62 due to heart disease Mother. Cancer, Cerebrovascular Accident. esophageal cancer 2001, CVA 2005   Social History Alcohol use. Formerly drank alcohol. Current work status. Unemployed. Children.  0. Marital status. Married. Exercise. Light. Tobacco use. Current every day smoker. Advance Directives. none Tobacco use. Current every day smoker, Smokes < 1 pack of cigarettes per day. Non drinker / no alcohol Use Post-Surgical Plans. home with husband   Medication History Hydrocodone-Acetaminophen (10-325MG  Tablet, Oral) Active. Robaxin (500MG  Tablet, 1 Oral PO Q 6-8HRS PRN PAIN, Taken starting 03/18/2012) Active. KlonoPIN (1MG  Tablet, Oral three times daily) Active.   Past Surgical History Tonsillectomy Arthroscopic Knee Surgery - Both Cholecystectomy Salpingectomy; Unilateral. Right Side Exploratory Lap. for Ectopic Pregnancy Tubal Ligation Lumpectomy. Right Breast (Noncancerous) Total Knee Replacement - Right  Medical Hiatory Hypercholesterolemia Anxiety Disorder Gastroesophageal Reflux Disease   Review of Systems General:Not Present- Chills, Fever, Night Sweats, Fatigue, Weight Gain, Weight Loss and Memory Loss. Skin:Not Present- Hives, Itching, Rash, Eczema and Lesions. HEENT:Not Present- Tinnitus, Headache, Double Vision, Visual Loss, Hearing Loss and Dentures. Respiratory:Not Present- Shortness of breath with exertion, Shortness of breath at rest, Allergies, Coughing up blood and Chronic Cough. Cardiovascular:Not Present- Chest Pain, Racing/skipping heartbeats, Difficulty Breathing Lying Down, Murmur, Swelling and Palpitations. Gastrointestinal:Not Present- Bloody Stool, Heartburn, Abdominal Pain, Vomiting, Nausea, Constipation, Diarrhea, Difficulty Swallowing, Jaundice and Loss of appetitie. Female Genitourinary:Not Present- Blood in Urine, Urinary frequency, Weak urinary stream, Discharge, Flank Pain, Incontinence, Painful Urination, Urgency, Urinary Retention and Urinating at Night. Musculoskeletal:Present- Joint Pain. Not Present- Muscle Weakness, Muscle Pain, Joint Swelling, Back Pain, Morning Stiffness and Spasms. Neurological:Not  Present- Tremor, Dizziness, Blackout spells, Paralysis, Difficulty with balance and Weakness. Psychiatric:Not Present- Insomnia.   Vitals Weight: 202 lb Height: 65 in Body Surface Area: 2.05 m Body Mass Index: 33.61 kg/m Pulse: 72 (Regular) Resp.: 14 (Unlabored) BP: 138/78 (Sitting, Right Arm, Standard)  Physical Exam The physical exam findings are  as follows:   General Mental Status - Alert, cooperative and good historian. General Appearance- pleasant. Not in acute distress. Orientation- Oriented X3. Build & Nutrition- Well nourished and Well developed.   Head and Neck Head- normocephalic, atraumatic . Neck Global Assessment- supple. no bruit auscultated on the right and no bruit auscultated on the left.   Eye Pupil- Bilateral- Regular and Round. Motion- Bilateral- EOMI.   Chest and Lung Exam Auscultation: Breath sounds:- clear at anterior chest wall and - clear at posterior chest wall. Adventitious sounds:- No Adventitious sounds.   Cardiovascular Auscultation:Rhythm- Regular rate and rhythm. Heart Sounds- S1 WNL and S2 WNL. Murmurs & Other Heart Sounds:Auscultation of the heart reveals - No Murmurs.   Abdomen Palpation/Percussion:Tenderness- Abdomen is non-tender to palpation. Rigidity (guarding)- Abdomen is soft. Auscultation:Auscultation of the abdomen reveals - Bowel sounds normal.   Female Genitourinary  Not done, not pertinent to present illness  Musculoskeletal Lower Extremity Left Lower Extremity: Left Knee:Evaluation of related systems reveals - no rashes, ulcers or lesions of bilateral lower extremities and neurovascularly intact bilaterally. Inspection and Palpation:Tenderness- tender to palpation. Crepitus- mild. Effusion- mild. Sensation- intact to light touch. Other characteristics- no ecchymosis and no erythema. ROM: Flexion:PROM- 120 . Extension:PROM- 5 .   Assessment &  Plan Osteoarthritis, knee (715.96) Impression: Left Knee  S/P Right total knee arthroplasty (V43.65)  Note: Patient is for a left total knee replacement by Dr. Lequita Halt.  Plan is to go home.  Signed electronically by Roberts Gaudy, PA-C

## 2012-04-26 NOTE — Anesthesia Postprocedure Evaluation (Signed)
Anesthesia Post Note  Patient: Brittany Moon  Procedure(s) Performed: Procedure(s) (LRB): TOTAL KNEE ARTHROPLASTY (Left)  Anesthesia type: Spinal  Patient location: PACU  Post pain: Pain level controlled  Post assessment: Post-op Vital signs reviewed  Last Vitals:  Filed Vitals:   04/26/12 1445  BP: 147/80  Pulse: 66  Temp: 36.2 C  Resp:     Post vital signs: Reviewed  Level of consciousness: sedated  Complications: No apparent anesthesia complications

## 2012-04-26 NOTE — Anesthesia Preprocedure Evaluation (Signed)
Anesthesia Evaluation  Patient identified by MRN, date of birth, ID band Patient awake    Reviewed: Allergy & Precautions, H&P , NPO status , Patient's Chart, lab work & pertinent test results  History of Anesthesia Complications (+) PONV  Airway Mallampati: II TM Distance: >3 FB Neck ROM: Full    Dental No notable dental hx. (+) Teeth Intact and Dental Advisory Given   Pulmonary shortness of breath, pneumonia -, Current Smoker,  breath sounds clear to auscultation  Pulmonary exam normal       Cardiovascular Exercise Tolerance: Good hypertension, Rhythm:Regular Rate:Normal     Neuro/Psych PSYCHIATRIC DISORDERS Anxiety Depression negative neurological ROS     GI/Hepatic Neg liver ROS, GERD-  Medicated,  Endo/Other  negative endocrine ROS  Renal/GU negative Renal ROS  negative genitourinary   Musculoskeletal negative musculoskeletal ROS (+)   Abdominal (+) + obese,   Peds  Hematology negative hematology ROS (+)   Anesthesia Other Findings   Reproductive/Obstetrics negative OB ROS                           Anesthesia Physical Anesthesia Plan  ASA: II  Anesthesia Plan: Spinal   Post-op Pain Management:    Induction: Intravenous  Airway Management Planned: Simple Face Mask  Additional Equipment:   Intra-op Plan:   Post-operative Plan:   Informed Consent: I have reviewed the patients History and Physical, chart, labs and discussed the procedure including the risks, benefits and alternatives for the proposed anesthesia with the patient or authorized representative who has indicated his/her understanding and acceptance.   Dental advisory given  Plan Discussed with: CRNA  Anesthesia Plan Comments:         Anesthesia Quick Evaluation

## 2012-04-27 ENCOUNTER — Encounter (HOSPITAL_COMMUNITY): Payer: Self-pay | Admitting: Orthopedic Surgery

## 2012-04-27 LAB — BASIC METABOLIC PANEL
BUN: 7 mg/dL (ref 6–23)
Calcium: 8.7 mg/dL (ref 8.4–10.5)
Creatinine, Ser: 0.75 mg/dL (ref 0.50–1.10)
GFR calc Af Amer: >90 mL/min (ref >90)
GFR calc non Af Amer: >90 mL/min (ref >90)
Glucose, Bld: 124 mg/dL — ABNORMAL HIGH (ref 70–99)
Potassium: 3.8 mEq/L (ref 3.5–5.1)

## 2012-04-27 LAB — CBC
HCT: 36.3 % (ref 36.0–46.0)
Hemoglobin: 12.1 g/dL (ref 12.0–15.0)
MCH: 29.1 pg (ref 26.0–34.0)
MCHC: 33.3 g/dL (ref 30.0–36.0)
MCV: 87.3 fL (ref 78.0–100.0)
RDW: 14.4 % (ref 11.5–15.5)

## 2012-04-27 MED ORDER — HYDROCODONE-ACETAMINOPHEN 10-325 MG PO TABS
1.0000 | ORAL_TABLET | ORAL | Status: DC | PRN
  Administered 2012-04-27 – 2012-04-28 (×8): 2 via ORAL
  Filled 2012-04-27 (×8): qty 2

## 2012-04-27 MED ORDER — DIAZEPAM 5 MG PO TABS
5.0000 mg | ORAL_TABLET | Freq: Four times a day (QID) | ORAL | Status: DC | PRN
  Administered 2012-04-27 – 2012-04-28 (×5): 5 mg via ORAL
  Filled 2012-04-27 (×5): qty 1

## 2012-04-27 NOTE — Progress Notes (Signed)
   Subjective: 1 Day Post-Op Procedure(s) (LRB): TOTAL KNEE ARTHROPLASTY (Left) Patient reports pain as severe.  Teary eyed during visit.  Using the PCA.  Will continue today.  Patient seen in rounds with Dr. Lequita Halt.  Discussed changing pain meds.  Patient wants to go to [facility]. Patient is having problems with pain in the knee, requiring pain medications We will start therapy today.  Plan is to go Home after hospital stay.  Objective: Vital signs in last 24 hours: Temp:  [97.1 F (36.2 C)-98.7 F (37.1 C)] 98.5 F (36.9 C) (11/12 0644) Pulse Rate:  [59-91] 76  (11/12 0644) Resp:  [10-79] 18  (11/12 0644) BP: (96-169)/(33-80) 169/74 mmHg (11/12 0644) SpO2:  [95 %-100 %] 97 % (11/12 0644) Weight:  [89.812 kg (198 lb)] 89.812 kg (198 lb) (11/11 1445)  Intake/Output from previous day:  Intake/Output Summary (Last 24 hours) at 04/27/12 0750 Last data filed at 04/27/12 0700  Gross per 24 hour  Intake 5083.94 ml  Output   2550 ml  Net 2533.94 ml    Intake/Output this shift: UOP 950 +2533  Labs:  Basename 04/27/12 0503  HGB 12.1    Basename 04/27/12 0503  WBC 7.7  RBC 4.16  HCT 36.3  PLT 172    Basename 04/27/12 0503  NA 139  K 3.8  CL 107  CO2 25  BUN 7  CREATININE 0.75  GLUCOSE 124*  CALCIUM 8.7   No results found for this basename: LABPT:2,INR:2 in the last 72 hours  EXAM General - Patient is Alert, Oriented, Emotional, Crying at time of visit. Extremity - Neurovascular intact Sensation intact distally Dorsiflexion/Plantar flexion intact Dressing - dressing C/D/I Motor Function - intact, moving foot and toes well on exam.  Hemovac pulled without difficulty.  Past Medical History  Diagnosis Date  . Depression   . GERD (gastroesophageal reflux disease)   . Obesity   . Arthritis   . Shingles 2 yrs ago    on face  . Hypertension 01-08-12    past hx. elevated bp, no current bp meds  . Adverse effect of other general anesthetics, sequela 01-08-12   prone to panic attacks after anesthesia, coming from under  . PONV (postoperative nausea and vomiting) 01-08-12    many yrs ago  . Anxiety   . Shortness of breath 01-08-12        denies  04/22/12  . Pneumonia     Assessment/Plan: 1 Day Post-Op Procedure(s) (LRB): TOTAL KNEE ARTHROPLASTY (Left) Principal Problem:  *OA (osteoarthritis) of knee  Estimated Body mass index is 32.95 kg/(m^2) as calculated from the following:   Height as of this encounter: 5\' 5" (1.651 m).   Weight as of this encounter: 198 lb(89.812 kg). Advance diet Up with therapy Discharge home with home health DC Foley DVT Prophylaxis - Xarelto Weight-Bearing as tolerated to left leg No vaccines. Continue PCA Dilaudid, Change oral pain med and change the muscle relaxant. Continue O2 and Pulse OX due to the PCA  Ludean Duhart 04/27/2012, 7:50 AM

## 2012-04-27 NOTE — Care Management Note (Signed)
    Page 1 of 2   04/27/2012     4:54:26 PM   CARE MANAGEMENT NOTE 04/27/2012  Patient:  Brittany Moon, Brittany Moon   Account Number:  [id number]  Date Initiated:  04/27/2012  Documentation initiated by:  Colleen Can  Subjective/Objective Assessment:   dx osteoarthritis left knmee; total knee replacemnt     Action/Plan:   CM spoke with patient. Plans are for pt to go back to her home in [place] where spouse and her 2 sisters will be caregivers. She laready has RW and 2 canes. Requesting Genevieve Norlander for [facility] services.   Anticipated DC Date:  04/29/2012   Anticipated DC Plan:  HOME W HOME HEALTH SERVICES  In-house referral  NA      DC Planning Services  CM consult      [facility] Choice  HOME HEALTH   Choice offered to / List presented to:  C-1 Patient   DME arranged  NA      DME agency  NA     HH arranged  HH-2 PT      [facility] agency  [facility]   Status of service:  In process, will continue to follow Medicare Important Message given?   (If response is "NO", the following Medicare IM given date fields will be blank) Date Medicare IM given:   Date Additional Medicare IM given:    Discharge Disposition:    Per UR Regulation:    If discussed at Long Length of Stay Meetings, dates discussed:    Comments:  04/27/2012 Damaris Schooner RN CCM [phone] Genevieve Norlander will provide [facility] services day after pt is discharged. They will arrange for needed DME

## 2012-04-27 NOTE — Evaluation (Signed)
Occupational Therapy Evaluation Patient Details Name: RIELLA LILLIS MRN: [id number] DOB: 1960/08/09 Today's Date: 04/27/2012 Time: 9629-5284 OT Time Calculation (min): 30 min  OT Assessment / Plan / Recommendation Clinical Impression  Pt presents POD 1 RTKR. Skilled OT recommended to maximize independence with BADLs to supervision level in prep for d/c home.    OT Assessment  Patient needs continued OT Services    Follow Up Recommendations  No OT follow up    Barriers to Discharge      Equipment Recommendations  None recommended by OT    Recommendations for Other Services    Frequency  Min 2X/week    Precautions / Restrictions Precautions Precautions: Knee Required Braces or Orthoses: Knee Immobilizer - Left Knee Immobilizer - Left: Discontinue once straight leg raise with < 10 degree lag Restrictions Weight Bearing Restrictions: No   Pertinent Vitals/Pain Pt repositioned for comfort and cold applied. Reported 6/10 pain    ADL  Grooming: Simulated;Min guard Where Assessed - Grooming: Supported standing Upper Body Bathing: Simulated;Set up Where Assessed - Upper Body Bathing: Unsupported sitting Lower Body Bathing: Simulated;Minimal assistance Where Assessed - Lower Body Bathing: Supported sit to stand Upper Body Dressing: Simulated;Set up Where Assessed - Upper Body Dressing: Unsupported sitting Lower Body Dressing: Simulated;Moderate assistance Where Assessed - Lower Body Dressing: Supported sit to stand Toilet Transfer: Mining engineer Method: Sit to Barista: Other (comment) Nurse, children's) Toileting - Clothing Manipulation and Hygiene: Simulated;Minimal assistance Where Assessed - Engineer, mining and Hygiene: Standing Equipment Used: Rolling walker Transfers/Ambulation Related to ADLs: Pt ambulated with minguard A. Max cueing for safe manipulation of RW, hand placement. Pt tends to be impulsive.      OT Diagnosis: Generalized weakness  OT Problem List: Decreased activity tolerance;Decreased safety awareness;Pain OT Treatment Interventions: Self-care/ADL training;Therapeutic activities;DME and/or AE instruction;Patient/family education   OT Goals Acute Rehab OT Goals OT Goal Formulation: With patient Time For Goal Achievement: 05/11/12 Potential to Achieve Goals: Good ADL Goals Pt Will Perform Grooming: with supervision;Standing at sink ADL Goal: Grooming - Progress: Goal set today Pt Will Perform Lower Body Bathing: with supervision;Sit to stand from chair;Sit to stand from bed ADL Goal: Lower Body Bathing - Progress: Goal set today Pt Will Perform Lower Body Dressing: with supervision;Sit to stand from chair;Sit to stand from bed ADL Goal: Lower Body Dressing - Progress: Goal set today Pt Will Transfer to Toilet: with supervision;3-in-1;Ambulation ADL Goal: Toilet Transfer - Progress: Goal set today Pt Will Perform Toileting - Hygiene: with supervision;Sit to stand from 3-in-1/toilet ADL Goal: Toileting - Hygiene - Progress: Goal set today Pt Will Perform Tub/Shower Transfer: with supervision;Ambulation ADL Goal: Tub/Shower Transfer - Progress: Goal set today  Visit Information  Last OT Received On: 04/27/12 Assistance Needed: +1 PT/OT Co-Evaluation/Treatment: Yes    Subjective Data  Subjective: I had my other knee done in June. Patient Stated Goal: Not asked   Prior Functioning     Home Living Lives With: Spouse Available Help at Discharge: Family Type of Home: House Home Access: Stairs to enter [employer] of Steps: 7 Entrance Stairs-Rails: Right;Left;Can reach both Home Layout: Able to live on main level with bedroom/bathroom;Two level Bathroom Shower/Tub: Health visitor: Handicapped height Home Adaptive Equipment: Shower chair with back;Bedside commode/3-in-1;Walker - rolling Prior Function Level of Independence: Independent with  assistive device(s) Able to Take Stairs?: Yes Driving: Yes Vocation: Retired Musician: No difficulties Dominant Hand: Right         Vision/Perception  Cognition  Overall Cognitive Status: Appears within functional limits for tasks assessed/performed Arousal/Alertness: Awake/alert Orientation Level: Appears intact for tasks assessed Behavior During Session: [facility] for tasks performed    Extremity/Trunk Assessment Right Upper Extremity Assessment RUE ROM/Strength/Tone: [facility] for tasks assessed Left Upper Extremity Assessment LUE ROM/Strength/Tone: WFL for tasks assessed Right Lower Extremity Assessment RLE ROM/Strength/Tone: WFL for tasks assessed RLE Sensation: WFL - Light Touch RLE Coordination: WFL - gross/fine motor Left Lower Extremity Assessment LLE ROM/Strength/Tone: Deficits LLE ROM/Strength/Tone Deficits: ankle motions WFL, unable to perform SLR due to weakness and increased pain at this time.  LLE Sensation: WFL - Light Touch Trunk Assessment Trunk Assessment: Normal     Mobility Bed Mobility Bed Mobility: Supine to Sit;Sitting - Scoot to Edge of Bed Supine to Sit: 4: Min assist;HOB elevated;With rails Sitting - Scoot to Edge of Bed: 4: Min assist Details for Bed Mobility Assistance: Assist for LLE out of bed with cues for hand placement on bed to self assist to EOB.  Transfers Sit to Stand: 4: Min assist;From elevated surface;With upper extremity assist;From bed Stand to Sit: 4: Min assist;With upper extremity assist;With armrests;To chair/3-in-1 Details for Transfer Assistance: Assist to rise and steady with max cues for safety due to pt wanting to pull up on RW and sit without reaching back for bed.       Shoulder Instructions     Exercise     Balance     End of Session OT - End of Session Equipment Utilized During Treatment: Gait belt Activity Tolerance: Patient tolerated treatment well Patient left: in chair;with call bell/phone  within reach  GO     Teran Daughenbaugh A OTR/L [phone] 04/27/2012, 12:31 PM

## 2012-04-27 NOTE — Evaluation (Signed)
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  Tolerated OOB and ambulation in hallway well with min assist and no increase c/o pain.  Pt will benefit from skilled PT in acute venue to address deficits.  PT recommends HHPT for follow up at D/C to maximize pts function and independence.      PT Assessment  Patient needs continued PT services    Follow Up Recommendations  Home health PT    Does the patient have the potential to tolerate intense rehabilitation      Barriers to Discharge None      Equipment Recommendations  None recommended by PT    Recommendations for Other Services     Frequency 7X/week    Precautions / Restrictions Precautions Precautions: Knee Required Braces or Orthoses: Knee Immobilizer - Left Knee Immobilizer - Left: Discontinue once straight leg raise with < 10 degree lag Restrictions Weight Bearing Restrictions: No   Pertinent Vitals/Pain 4/10, ice packs applied      Mobility  Bed Mobility Bed Mobility: Supine to Sit;Sitting - Scoot to Edge of Bed Supine to Sit: 4: Min assist;HOB elevated;With rails Sitting - Scoot to Edge of Bed: 4: Min assist Details for Bed Mobility Assistance: Assist for LLE out of bed with cues for hand placement on bed to self assist to EOB.  Transfers Transfers: Sit to Stand;Stand to Sit Sit to Stand: 4: Min assist;From elevated surface;With upper extremity assist;From bed Stand to Sit: 4: Min assist;With upper extremity assist;With armrests;To chair/3-in-1 Details for Transfer Assistance: Assist to rise and steady with max cues for safety due to pt wanting to pull up on RW and sit without reaching back for bed.    Ambulation/Gait Ambulation/Gait Assistance: 4: Min assist Ambulation Distance (Feet): 100 Feet Assistive device: Rolling walker Ambulation/Gait Assistance Details: Min cues for sequencing/technique with RW and safety, esp when turning.  Gait Pattern: Step-to pattern;Decreased stance time - left;Decreased step length - right;Decreased stride length Gait velocity: decreased Stairs: No Wheelchair Mobility Wheelchair Mobility: No    Shoulder Instructions     Exercises     PT Diagnosis: Difficulty walking;Generalized weakness;Acute pain  PT Problem List: Decreased strength;Decreased range of motion;Decreased activity tolerance;Decreased balance;Decreased mobility;Decreased knowledge of use of DME;Decreased safety awareness;Pain PT Treatment Interventions: DME instruction;Gait training;Stair training;Functional mobility training;Therapeutic activities;Therapeutic exercise;Balance training;Patient/family education   PT Goals Acute Rehab PT Goals PT Goal Formulation: With patient Time For Goal Achievement: 04/30/12 Potential to Achieve Goals: Good Pt will go Supine/Side to Sit: with supervision PT Goal: Supine/Side to Sit - Progress: Goal set today Pt will go Sit to Supine/Side: with supervision PT Goal: Sit to Supine/Side - Progress: Goal set today Pt will go Sit to Stand: with supervision PT Goal: Sit to Stand - Progress: Goal set today Pt will Ambulate: 51 - 150 feet;with supervision;with least restrictive assistive device PT Goal: Ambulate - Progress: Goal set today Pt will Go Up / Down Stairs: 6-9 stairs;with min assist;with rail(s);with least restrictive assistive device PT Goal: Up/Down Stairs - Progress: Goal set today Pt will Perform Home Exercise Program: with supervision, verbal cues required/provided PT Goal: Perform Home Exercise Program - Progress: Goal set today  Visit Information  Last PT Received On: 04/27/12 Assistance Needed: +1  Subjective Data  Subjective:  I'm not going to fall Patient Stated Goal: n/a   Prior Functioning  Home Living Lives With: Spouse Available Help at Discharge: Family Type of Home: House Home Access: Stairs to enter [employer] of Steps: 7 Entrance Stairs-Rails: Right;Left;Can reach both Home Layout: Able to live on main level with bedroom/bathroom;Two level Bathroom Shower/Tub: Health visitor: Handicapped height Home Adaptive Equipment: Shower chair with back;Bedside commode/3-in-1;Walker - rolling Prior Function Level of Independence: Independent with assistive device(s) Able to Take Stairs?: Yes Driving: Yes Vocation: Retired Musician: No difficulties Dominant Hand: Right    Cognition  Overall Cognitive Status: Appears within functional limits for tasks assessed/performed Arousal/Alertness: Awake/alert Orientation Level: Appears intact for tasks assessed Behavior During Session: [facility] for tasks performed    Extremity/Trunk Assessment Right Lower Extremity Assessment RLE ROM/Strength/Tone: WFL for tasks assessed RLE Sensation: WFL - Light Touch RLE Coordination: WFL - gross/fine motor Left Lower Extremity Assessment LLE ROM/Strength/Tone: Deficits LLE ROM/Strength/Tone Deficits: ankle motions WFL, unable to perform SLR due to weakness and increased pain at this time.  LLE Sensation: WFL - Light Touch Trunk Assessment Trunk Assessment: Normal   Balance    End of Session PT - End of Session Equipment Utilized During Treatment: Left knee immobilizer Activity Tolerance: Patient tolerated treatment well Patient left: in chair;with call bell/phone within reach Nurse Communication: Mobility status  GP     Page, Meribeth Mattes 04/27/2012, 11:39 AM

## 2012-04-27 NOTE — Progress Notes (Signed)
Physical Therapy Treatment Patient Details Name: Brittany Moon MRN: [id number] DOB: [birth year] Today's Date: 04/27/2012 Time: 4098-1191 PT Time Calculation (min): 33 min  PT Assessment / Plan / Recommendation Comments on Treatment Session  Pt progressing well with mobility and wants to "push herself" to do her best.  Cautioned on doing too much and becoming too sore.      Follow Up Recommendations  Home health PT     Does the patient have the potential to tolerate intense rehabilitation     Barriers to Discharge        Equipment Recommendations  None recommended by OT    Recommendations for Other Services    Frequency 7X/week   Plan Discharge plan remains appropriate    Precautions / Restrictions Precautions Precautions: Knee Required Braces or Orthoses: Knee Immobilizer - Left Knee Immobilizer - Left: Discontinue once straight leg raise with < 10 degree lag Restrictions Weight Bearing Restrictions: No   Pertinent Vitals/Pain 8/10    Mobility  Bed Mobility Bed Mobility: Supine to Sit;Sit to Supine Supine to Sit: 4: Min assist;HOB elevated;With rails Sit to Supine: 4: Min assist;HOB flat Details for Bed Mobility Assistance: Assist for LLE into and out of bed with cues for hand placement to self assist.   Transfers Transfers: Sit to Stand;Stand to Sit Sit to Stand: 4: Min guard;From elevated surface;With armrests;From bed Stand to Sit: 4: Min guard;With upper extremity assist;To elevated surface;To bed Details for Transfer Assistance: Min/gaurd for safety with max cues for hand placement.  Pt continuously wants to pull up on RW.  Ambulation/Gait Ambulation/Gait Assistance: 4: Min assist Ambulation Distance (Feet): 100 Feet Assistive device: Rolling walker Ambulation/Gait Assistance Details: Min cues for sequencing/technique with RW and to increase UE WB through RW to decrease antalgic gait pattern.  Gait Pattern: Step-to pattern;Decreased stance time - left;Decreased  step length - right;Decreased stride length Gait velocity: decreased    Exercises Total Joint Exercises Ankle Circles/Pumps: AROM;Both;20 reps Quad Sets: AROM;Left;10 reps Heel Slides: AAROM;Left;10 reps Hip ABduction/ADduction: AAROM;Left;10 reps Straight Leg Raises: AAROM;Left;10 reps   PT Diagnosis:    PT Problem List:   PT Treatment Interventions:     PT Goals Acute Rehab PT Goals PT Goal Formulation: With patient Time For Goal Achievement: 04/30/12 Potential to Achieve Goals: Good Pt will go Supine/Side to Sit: with supervision PT Goal: Supine/Side to Sit - Progress: Progressing toward goal Pt will go Sit to Supine/Side: with supervision PT Goal: Sit to Supine/Side - Progress: Progressing toward goal Pt will go Sit to Stand: with supervision PT Goal: Sit to Stand - Progress: Progressing toward goal Pt will Ambulate: 51 - 150 feet;with supervision;with least restrictive assistive device PT Goal: Ambulate - Progress: Progressing toward goal Pt will Perform Home Exercise Program: with supervision, verbal cues required/provided PT Goal: Perform Home Exercise Program - Progress: Progressing toward goal  Visit Information  Last PT Received On: 04/27/12 Assistance Needed: +1    Subjective Data  Subjective: I'm going to push myself.  Patient Stated Goal: n/a   Cognition  Overall Cognitive Status: Appears within functional limits for tasks assessed/performed Arousal/Alertness: Awake/alert Orientation Level: Appears intact for tasks assessed Behavior During Session: [facility] for tasks performed    Balance     End of Session PT - End of Session Equipment Utilized During Treatment: Left knee immobilizer Activity Tolerance: Patient tolerated treatment well Patient left: in bed;with call bell/phone within reach Nurse Communication: Mobility status   GP     Page, Meribeth Mattes  04/27/2012, 4:49 PM

## 2012-04-28 LAB — BASIC METABOLIC PANEL
BUN: 11 mg/dL (ref 6–23)
Chloride: 104 mEq/L (ref 96–112)
Creatinine, Ser: 0.65 mg/dL (ref 0.50–1.10)
GFR calc Af Amer: >90 mL/min (ref >90)

## 2012-04-28 LAB — CBC
HCT: 35.5 % — ABNORMAL LOW (ref 36.0–46.0)
MCHC: 33.8 g/dL (ref 30.0–36.0)
MCV: 86.2 fL (ref 78.0–100.0)
RDW: 14.1 % (ref 11.5–15.5)
WBC: 13.4 10*3/uL — ABNORMAL HIGH (ref 4.0–10.5)

## 2012-04-28 MED ORDER — HYDROMORPHONE HCL PF 1 MG/ML IJ SOLN: 0.5000 mg | INTRAMUSCULAR | Status: DC | PRN

## 2012-04-28 MED ORDER — RIVAROXABAN 10 MG PO TABS: 10.0000 mg | ORAL_TABLET | Freq: Every day | ORAL | Status: AC

## 2012-04-28 MED ORDER — DIAZEPAM 5 MG PO TABS: 5.0000 mg | ORAL_TABLET | Freq: Four times a day (QID) | ORAL | Status: AC | PRN

## 2012-04-28 MED ORDER — HYDROCODONE-ACETAMINOPHEN 10-325 MG PO TABS: 1.0000 | ORAL_TABLET | ORAL | Status: AC | PRN

## 2012-04-28 NOTE — Progress Notes (Signed)
Physical Therapy Treatment Patient Details Name: Brittany Moon MRN: [id number] DOB: 02/11/1961 Today's Date: 04/28/2012 Time: 4696-2952 PT Time Calculation (min): 23 min  PT Assessment / Plan / Recommendation Comments on Treatment Session  Pt progressing well with mobility and wants to "push herself" to do her best.  Cautioned on doing too much and becoming too sore.      Follow Up Recommendations  Home health PT     Does the patient have the potential to tolerate intense rehabilitation     Barriers to Discharge        Equipment Recommendations  None recommended by OT    Recommendations for Other Services    Frequency 7X/week   Plan Discharge plan remains appropriate    Precautions / Restrictions Precautions Precautions: Knee Required Braces or Orthoses: Knee Immobilizer - Left Knee Immobilizer - Left: Discontinue once straight leg raise with < 10 degree lag Restrictions Weight Bearing Restrictions: No   Pertinent Vitals/Pain 8/10, premedicated, RN aware    Mobility  Bed Mobility Bed Mobility: Supine to Sit;Sit to Supine Supine to Sit: 5: Supervision Sitting - Scoot to Edge of Bed: 4: Min guard Details for Bed Mobility Assistance: Pt self assisting L LE with R Transfers Transfers: Sit to Stand;Stand to Sit Sit to Stand: 5: Supervision Stand to Sit: 5: Supervision Details for Transfer Assistance: cues for use of UEs and for LE management Ambulation/Gait Ambulation/Gait Assistance: 4: Min guard;5: Supervision Ambulation Distance (Feet): 200 Feet (and 24') Assistive device: Rolling walker Ambulation/Gait Assistance Details: cues for position from RW and posture Gait Pattern: Step-to pattern;Step-through pattern Gait velocity: decreased Stairs: Yes Stairs Assistance: 4: Min assist Stairs Assistance Details (indicate cue type and reason): cues for sequence and crutch placement Stair Management Technique: One rail Right;Forwards;With crutches;Step to pattern Number  of Stairs: 4     Exercises Total Joint Exercises Ankle Circles/Pumps: AROM;Both;20 reps Quad Sets: AROM;Left;20 reps;Supine Heel Slides: AAROM;Left;20 reps;Supine Straight Leg Raises: AAROM;Left;AROM;20 reps;Supine   PT Diagnosis:    PT Problem List:   PT Treatment Interventions:     PT Goals Acute Rehab PT Goals PT Goal Formulation: With patient Time For Goal Achievement: 04/30/12 Potential to Achieve Goals: Good Pt will go Supine/Side to Sit: with supervision PT Goal: Supine/Side to Sit - Progress: Progressing toward goal Pt will go Sit to Supine/Side: with supervision PT Goal: Sit to Supine/Side - Progress: Progressing toward goal Pt will go Sit to Stand: with supervision PT Goal: Sit to Stand - Progress: Progressing toward goal Pt will Ambulate: 51 - 150 feet;with supervision;with least restrictive assistive device PT Goal: Ambulate - Progress: Progressing toward goal Pt will Go Up / Down Stairs: 6-9 stairs;with min assist;with rail(s);with least restrictive assistive device PT Goal: Up/Down Stairs - Progress: Progressing toward goal Pt will Perform Home Exercise Program: with supervision, verbal cues required/provided PT Goal: Perform Home Exercise Program - Progress: Progressing toward goal  Visit Information  Last PT Received On: 04/28/12 Assistance Needed: +1    Subjective Data  Subjective: I'm thinking about going home if my pain stays under control today Patient Stated Goal: REsume previous lifestyle with decreased pain   Cognition  Overall Cognitive Status: Appears within functional limits for tasks assessed/performed Arousal/Alertness: Awake/alert Orientation Level: Appears intact for tasks assessed Behavior During Session: [facility] for tasks performed    Balance     End of Session PT - End of Session Equipment Utilized During Treatment: Left knee immobilizer Activity Tolerance: Patient tolerated treatment well Patient left: with  call bell/phone within reach;in  bed   GP     Zanyia Silbaugh 04/28/2012, 5:07 PM

## 2012-04-28 NOTE — Progress Notes (Signed)
Occupational Therapy Treatment Patient Details Name: Brittany Moon MRN: [id number] DOB: [birth year] Today's Date: 04/28/2012 Time: 4098-1191 OT Time Calculation (min): 24 min  OT Assessment / Plan / Recommendation Comments on Treatment Session Pt doing well. Hopes to d/c today. All goals met as stated on initial eval.    Follow Up Recommendations  No OT follow up    Barriers to Discharge       Equipment Recommendations  None recommended by OT    Recommendations for Other Services    Frequency     Plan All goals met and education completed, patient discharged from OT services    Precautions / Restrictions Precautions Precautions: Knee Required Braces or Orthoses: Knee Immobilizer - Left Knee Immobilizer - Left: Discontinue once straight leg raise with < 10 degree lag Restrictions Weight Bearing Restrictions: No   Pertinent Vitals/Pain Pt reported 4/10 pain. Repositioned for comfort.    ADL  Grooming: Performed;Supervision/safety;Teeth care Where Assessed - Grooming: Supported standing Upper Body Dressing: Performed;Set up Where Assessed - Upper Body Dressing: Unsupported sitting Lower Body Dressing: Performed;Set up;Supervision/safety Where Assessed - Lower Body Dressing: Supported sit to stand;Unsupported sitting Toilet Transfer: Buyer, retail Method: Sit to Barista: Other (comment) (recliner.) Transfers/Ambulation Related to ADLs: Pt ambulated around room with supervision and was even able to reach out for items on bedside table without LOB. Pt was able to fully dress self including pants and socks without AD or physical A. Stated she would be spongebathing upon return home.    OT Diagnosis:    OT Problem List:   OT Treatment Interventions:     OT Goals ADL Goals ADL Goal: Grooming - Progress: Met ADL Goal: Lower Body Bathing - Progress: Met ADL Goal: Lower Body Dressing - Progress: Met ADL Goal: Toilet  Transfer - Progress: Met ADL Goal: Toileting - Hygiene - Progress: Met ADL Goal: Tub/Shower Transfer - Progress: Discontinued (comment) (Pt stated she would be spongebathing,)  Visit Information  Last OT Received On: 04/28/12 Assistance Needed: +1    Subjective Data  Subjective: I'd like to be able to go home today.   Prior Functioning       Cognition  Overall Cognitive Status: Appears within functional limits for tasks assessed/performed Arousal/Alertness: Awake/alert Orientation Level: Appears intact for tasks assessed Behavior During Session: [facility] for tasks performed    Mobility  Shoulder Instructions Bed Mobility Supine to Sit: 5: Supervision;HOB elevated Transfers Sit to Stand: 5: Supervision;From chair/3-in-1;From bed;With upper extremity assist Stand to Sit: 5: Supervision;With upper extremity assist;With armrests;To chair/3-in-1 Details for Transfer Assistance: min cues for hand placement. Pt did better today with no attempts to pull up on RW.       Exercises      Balance     End of Session OT - End of Session Activity Tolerance: Patient tolerated treatment well Patient left: in chair;with call bell/phone within reach  GO     Amando Ishikawa A OTR/L [phone] 04/28/2012, 12:18 PM

## 2012-04-28 NOTE — Progress Notes (Signed)
Physical Therapy Treatment Patient Details Name: DEZIYAH ARVIN MRN: [id number] DOB: January 18, 1961 Today's Date: 04/28/2012 Time:  -     PT Assessment / Plan / Recommendation Comments on Treatment Session  Pt progressing well with mobility and wants to "push herself" to do her best.  Cautioned on doing too much and becoming too sore.      Follow Up Recommendations  Home health PT     Does the patient have the potential to tolerate intense rehabilitation     Barriers to Discharge        Equipment Recommendations  None recommended by OT    Recommendations for Other Services    Frequency 7X/week   Plan Discharge plan remains appropriate    Precautions / Restrictions Precautions Precautions: Knee Required Braces or Orthoses: Knee Immobilizer - Left Knee Immobilizer - Left: Discontinue once straight leg raise with < 10 degree lag Restrictions Weight Bearing Restrictions: No   Pertinent Vitals/Pain 6/10; premedicated,. Cold packs provided    Mobility  Transfers Transfers: Sit to Stand;Stand to Sit Sit to Stand: 5: Supervision Stand to Sit: 5: Supervision Details for Transfer Assistance: cues for use of UEs and for LE management Ambulation/Gait Ambulation/Gait Assistance: 4: Min guard Assistive device: Rolling walker Gait Pattern: Step-to pattern;Decreased stance time - left;Decreased step length - right;Decreased stride length Gait velocity: decreased Stairs: No    Exercises Total Joint Exercises Ankle Circles/Pumps: AROM;Both;20 reps Quad Sets: AROM;Left;20 reps;Supine Heel Slides: AAROM;Left;20 reps;Supine Straight Leg Raises: AAROM;Left;AROM;20 reps;Supine   PT Diagnosis:    PT Problem List:   PT Treatment Interventions:     PT Goals Acute Rehab PT Goals PT Goal Formulation: With patient Time For Goal Achievement: 04/30/12 Potential to Achieve Goals: Good Pt will go Supine/Side to Sit: with supervision Pt will go Sit to Supine/Side: with supervision Pt will  go Sit to Stand: with supervision Pt will Ambulate: 51 - 150 feet;with supervision;with least restrictive assistive device Pt will Perform Home Exercise Program: with supervision, verbal cues required/provided  Visit Information  Last PT Received On: 04/28/12 Assistance Needed: +1    Subjective Data  Subjective: I'm thinking about going home if my pain stays under control today Patient Stated Goal: REsume previous lifestyle with decreased pain   Cognition  Overall Cognitive Status: Appears within functional limits for tasks assessed/performed Arousal/Alertness: Awake/alert Orientation Level: Appears intact for tasks assessed Behavior During Session: [facility] for tasks performed    Balance     End of Session PT - End of Session Equipment Utilized During Treatment: Left knee immobilizer Activity Tolerance: Patient tolerated treatment well Patient left: in chair;with call bell/phone within reach   GP     Yeira Gulden 04/28/2012, 5:01 PM

## 2012-04-28 NOTE — Progress Notes (Signed)
   Subjective: 2 Days Post-Op Procedure(s) (LRB): TOTAL KNEE ARTHROPLASTY (Left) Patient reports pain as mild and moderate.   Patient seen in rounds for Dr. Lequita Halt.  She is feeling much better today. Patient is well, but has had some minor complaints of pain in the knee, requiring pain medications Plan is to go Home after hospital stay.  Objective: Vital signs in last 24 hours: Temp:  [98.3 F (36.8 C)-98.4 F (36.9 C)] 98.3 F (36.8 C) (11/13 0512) Pulse Rate:  [79-88] 80  (11/13 0512) Resp:  [14-18] 16  (11/13 0512) BP: (127-150)/(72-80) 130/80 mmHg (11/13 0512) SpO2:  [93 %-99 %] 98 % (11/13 0512)  Intake/Output from previous day:  Intake/Output Summary (Last 24 hours) at 04/28/12 0839 Last data filed at 04/28/12 0831  Gross per 24 hour  Intake 1437.33 ml  Output   3675 ml  Net -2237.67 ml    Intake/Output this shift: Total I/O In: 290.3 [P.O.:240; I.V.:50.3] Out: 300 [Urine:300]  Labs:  [facility] 04/28/12 0437 04/27/12 0503  HGB 12.0 12.1    Basename 04/28/12 0437 04/27/12 0503  WBC 13.4* 7.7  RBC 4.12 4.16  HCT 35.5* 36.3  PLT 214 172    Basename 04/28/12 0437 04/27/12 0503  NA 140 139  K 3.7 3.8  CL 104 107  CO2 27 25  BUN 11 7  CREATININE 0.65 0.75  GLUCOSE 118* 124*  CALCIUM 9.6 8.7   No results found for this basename: LABPT:2,INR:2 in the last 72 hours  EXAM General - Patient is Alert, Appropriate and Oriented Extremity - Neurovascular intact Sensation intact distally Dorsiflexion/Plantar flexion intact No cellulitis present Dressing/Incision - clean, dry, no drainage, healing Motor Function - intact, moving foot and toes well on exam.   Past Medical History  Diagnosis Date  . Depression   . GERD (gastroesophageal reflux disease)   . Obesity   . Arthritis   . Shingles 2 yrs ago    on face  . Hypertension 01-08-12    past hx. elevated bp, no current bp meds  . Adverse effect of other general anesthetics, sequela 01-08-12    prone  to panic attacks after anesthesia, coming from under  . PONV (postoperative nausea and vomiting) 01-08-12    many yrs ago  . Anxiety   . Shortness of breath 01-08-12        denies  04/22/12  . Pneumonia     Assessment/Plan: 2 Days Post-Op Procedure(s) (LRB): TOTAL KNEE ARTHROPLASTY (Left) Principal Problem:  *OA (osteoarthritis) of knee  Estimated Body mass index is 32.95 kg/(m^2) as calculated from the following:   Height as of this encounter: 5\' 5" (1.651 m).   Weight as of this encounter: 198 lb(89.812 kg). Advance diet Up with therapy Plan for discharge tomorrow Discharge home with home health  DVT Prophylaxis - Xarelto Weight-Bearing as tolerated to left leg  Riko Lumsden 04/28/2012, 8:39 AM

## 2012-04-28 NOTE — Discharge Summary (Signed)
Physician Discharge Summary   Patient ID: Brittany Moon MRN: [id number] DOB/AGE: 02/21/1961 51 y.o.  Admit date: 04/26/2012 Discharge date: 04/28/2012  Primary Diagnosis: Osteoarthritis Left knee   Admission Diagnoses:  Past Medical History  Diagnosis Date  . Depression   . GERD (gastroesophageal reflux disease)   . Obesity   . Arthritis   . Shingles 2 yrs ago    on face  . Hypertension 01-08-12    past hx. elevated bp, no current bp meds  . Adverse effect of other general anesthetics, sequela 01-08-12    prone to panic attacks after anesthesia, coming from under  . PONV (postoperative nausea and vomiting) 01-08-12    many yrs ago  . Anxiety   . Shortness of breath 01-08-12        denies  04/22/12  . Pneumonia    Discharge Diagnoses:   Principal Problem:  *OA (osteoarthritis) of knee  Estimated Body mass index is 32.95 kg/(m^2) as calculated from the following:   Height as of this encounter: 5\' 5" (1.651 m).   Weight as of this encounter: 198 lb(89.812 kg).  Classification of overweight in adults according to BMI (WHO, 1998)   Procedure:  Procedure(s) (LRB): TOTAL KNEE ARTHROPLASTY (Left)   Consults: None  HPI: Brittany Moon is a 51 y.o. year old female with end stage OA of her left knee with progressively worsening pain and dysfunction. She has constant pain, with activity and at rest and significant functional deficits with difficulties even with ADLs. She has had extensive non-op management including analgesics, injections of cortisone and viscosupplements, and home exercise program, but remains in significant pain with significant dysfunction. Radiographs show joint space narrowing medial and patellofemoral and arthroscopy showed exposed bone in these areas. She presents now for left Total Knee Arthroplasty.   Laboratory Data: Admission on 04/26/2012  Component Date Value Range Status  . ABO/RH(D) 04/26/2012 A POS   Final  . Antibody Screen 04/26/2012 NEG   Final    . Sample Expiration 04/26/2012 04/29/2012   Final  . WBC 04/27/2012 7.7  4.0 - 10.5 K/uL Final  . RBC 04/27/2012 4.16  3.87 - 5.11 MIL/uL Final  . Hemoglobin 04/27/2012 12.1  12.0 - 15.0 g/dL Final  . HCT 40/98/1191 36.3  36.0 - 46.0 % Final  . MCV 04/27/2012 87.3  78.0 - 100.0 fL Final  . MCH 04/27/2012 29.1  26.0 - 34.0 pg Final  . MCHC 04/27/2012 33.3  30.0 - 36.0 g/dL Final  . RDW 47/82/9562 14.4  11.5 - 15.5 % Final  . Platelets 04/27/2012 172  150 - 400 K/uL Final  . Sodium 04/27/2012 139  135 - 145 mEq/L Final  . Potassium 04/27/2012 3.8  3.5 - 5.1 mEq/L Final  . Chloride 04/27/2012 107  96 - 112 mEq/L Final  . CO2 04/27/2012 25  19 - 32 mEq/L Final  . Glucose, Bld 04/27/2012 124* 70 - 99 mg/dL Final  . BUN 13/01/6577 7  6 - 23 mg/dL Final  . Creatinine, Ser 04/27/2012 0.75  0.50 - 1.10 mg/dL Final  . Calcium 46/96/2952 8.7  8.4 - 10.5 mg/dL Final  . GFR calc non Af Amer 04/27/2012 >90  >90 mL/min Final  . GFR calc Af Amer 04/27/2012 >90  >90 mL/min Final   Comment:                                 The  eGFR has been calculated                          using the CKD EPI equation.                          This calculation has not been                          validated in all clinical                          situations.                          eGFR's persistently                          <90 mL/min signify                          possible Chronic Kidney Disease.  . WBC 04/28/2012 13.4* 4.0 - 10.5 K/uL Final  . RBC 04/28/2012 4.12  3.87 - 5.11 MIL/uL Final  . Hemoglobin 04/28/2012 12.0  12.0 - 15.0 g/dL Final  . HCT 16/03/9603 35.5* 36.0 - 46.0 % Final  . MCV 04/28/2012 86.2  78.0 - 100.0 fL Final  . MCH 04/28/2012 29.1  26.0 - 34.0 pg Final  . MCHC 04/28/2012 33.8  30.0 - 36.0 g/dL Final  . RDW 54/02/8118 14.1  11.5 - 15.5 % Final  . Platelets 04/28/2012 214  150 - 400 K/uL Final  . Sodium 04/28/2012 140  135 - 145 mEq/L Final  . Potassium 04/28/2012 3.7  3.5 - 5.1  mEq/L Final  . Chloride 04/28/2012 104  96 - 112 mEq/L Final  . CO2 04/28/2012 27  19 - 32 mEq/L Final  . Glucose, Bld 04/28/2012 118* 70 - 99 mg/dL Final  . BUN 14/78/2956 11  6 - 23 mg/dL Final  . Creatinine, Ser 04/28/2012 0.65  0.50 - 1.10 mg/dL Final  . Calcium 21/30/8657 9.6  8.4 - 10.5 mg/dL Final  . GFR calc non Af Amer 04/28/2012 >90  >90 mL/min Final  . GFR calc Af Amer 04/28/2012 >90  >90 mL/min Final   Comment:                                 The eGFR has been calculated                          using the CKD EPI equation.                          This calculation has not been                          validated in all clinical                          situations.                          eGFR's persistently                          <  90 mL/min signify                          possible Chronic Kidney Disease.  Hospital Outpatient Visit on 04/21/2012  Component Date Value Range Status  . aPTT 04/21/2012 40* 24 - 37 seconds Final   Comment:                                 IF BASELINE aPTT IS ELEVATED,                          SUGGEST PATIENT RISK ASSESSMENT                          BE USED TO DETERMINE APPROPRIATE                          ANTICOAGULANT THERAPY.  . WBC 04/21/2012 10.2  4.0 - 10.5 K/uL Final  . RBC 04/21/2012 5.17* 3.87 - 5.11 MIL/uL Final  . Hemoglobin 04/21/2012 15.3* 12.0 - 15.0 g/dL Final  . HCT 16/03/9603 44.6  36.0 - 46.0 % Final  . MCV 04/21/2012 86.3  78.0 - 100.0 fL Final  . MCH 04/21/2012 29.6  26.0 - 34.0 pg Final  . MCHC 04/21/2012 34.3  30.0 - 36.0 g/dL Final  . RDW 54/02/8118 14.0  11.5 - 15.5 % Final  . Platelets 04/21/2012 297  150 - 400 K/uL Final  . Sodium 04/21/2012 139  135 - 145 mEq/L Final  . Potassium 04/21/2012 3.8  3.5 - 5.1 mEq/L Final  . Chloride 04/21/2012 102  96 - 112 mEq/L Final  . CO2 04/21/2012 28  19 - 32 mEq/L Final  . Glucose, Bld 04/21/2012 93  70 - 99 mg/dL Final  . BUN 14/78/2956 13  6 - 23 mg/dL Final  .  Creatinine, Ser 04/21/2012 0.77  0.50 - 1.10 mg/dL Final  . Calcium 21/30/8657 10.2  8.4 - 10.5 mg/dL Final  . Total Protein 04/21/2012 7.5  6.0 - 8.3 g/dL Final  . Albumin 84/69/6295 3.8  3.5 - 5.2 g/dL Final  . AST 28/41/3244 22  0 - 37 U/L Final  . ALT 04/21/2012 23  0 - 35 U/L Final  . Alkaline Phosphatase 04/21/2012 92  39 - 117 U/L Final  . Total Bilirubin 04/21/2012 0.3  0.3 - 1.2 mg/dL Final  . GFR calc non Af Amer 04/21/2012 >90  >90 mL/min Final  . GFR calc Af Amer 04/21/2012 >90  >90 mL/min Final   Comment:                                 The eGFR has been calculated                          using the CKD EPI equation.                          This calculation has not been                          validated in all clinical  situations.                          eGFR's persistently                          <90 mL/min signify                          possible Chronic Kidney Disease.  Marland Kitchen Prothrombin Time 04/21/2012 13.2  11.6 - 15.2 seconds Final  . INR 04/21/2012 1.01  0.00 - 1.49 Final  . Color, Urine 04/21/2012 AMBER* YELLOW Final   BIOCHEMICALS MAY BE AFFECTED BY COLOR  . APPearance 04/21/2012 TURBID* CLEAR Final  . Specific Gravity, Urine 04/21/2012 1.031* 1.005 - 1.030 Final  . pH 04/21/2012 5.0  5.0 - 8.0 Final  . Glucose, UA 04/21/2012 NEGATIVE  NEGATIVE mg/dL Final  . Hgb urine dipstick 04/21/2012 NEGATIVE  NEGATIVE Final  . Bilirubin Urine 04/21/2012 SMALL* NEGATIVE Final  . Ketones, ur 04/21/2012 TRACE* NEGATIVE mg/dL Final  . Protein, ur 09/81/1914 NEGATIVE  NEGATIVE mg/dL Final  . Urobilinogen, UA 04/21/2012 0.2  0.0 - 1.0 mg/dL Final  . Nitrite 78/29/5621 NEGATIVE  NEGATIVE Final  . Leukocytes, UA 04/21/2012 NEGATIVE  NEGATIVE Final  . MRSA, PCR 04/21/2012 NEGATIVE  NEGATIVE Final  . Staphylococcus aureus 04/21/2012 NEGATIVE  NEGATIVE Final   Comment:                                 The Xpert SA Assay (FDA                           approved for NASAL specimens                          in patients over 40 years of age),                          is one component of                          a comprehensive surveillance                          program.  Test performance has                          been validated by [employer] for patients greater                          than or equal to 69 year old.                          It is not intended                          to diagnose infection nor to  guide or monitor treatment.  . Squamous Epithelial / LPF 04/21/2012 FEW* RARE Final  . WBC, UA 04/21/2012 0-2  <3 WBC/hpf Final  . RBC / HPF 04/21/2012 0-2  <3 RBC/hpf Final  . Bacteria, UA 04/21/2012 FEW* RARE Final  . Crystals 04/21/2012 CA OXALATE CRYSTALS* NEGATIVE Final     X-Rays:Dg Chest 2 View  04/21/2012  *RADIOLOGY REPORT*  Clinical Data: Smoker, preoperative evaluation, left knee replacement  CHEST - 2 VIEW  Comparison:  11/13/2010  Findings:  The heart size and mediastinal contours are within normal limits.  Both lungs are clear.  The visualized skeletal structures are unremarkable.  IMPRESSION: No active cardiopulmonary disease.   Original Report Authenticated By: Judie Petit. Shick, M.D.     EKG:No orders found for this or any previous visit.   Hospital Course:  Brittany Moon is a 51 y.o. who was admitted to [facility]. They were brought to the operating room on 04/26/2012 and underwent Procedure(s): TOTAL KNEE ARTHROPLASTY.  Patient tolerated the procedure well and was later transferred to the recovery room and then to the orthopaedic floor for postoperative care.  They were given PO and IV analgesics for pain control following their surgery.  They were given 24 hours of postoperative antibiotics of  Anti-infectives     Start     Dose/Rate Route Frequency Ordered Stop   04/26/12 1800   ceFAZolin (ANCEF) IVPB 1 g/50 mL premix        1 g 100 mL/hr over 30  Minutes Intravenous Every 6 hours 04/26/12 1504 04/27/12 0059   04/26/12 1016   ceFAZolin (ANCEF) IVPB 2 g/50 mL premix        2 g 100 mL/hr over 30 Minutes Intravenous 30 min pre-op 04/26/12 1016 04/26/12 1156         and started on DVT prophylaxis in the form of Xarelto.   PT and OT were ordered for total joint protocol.  Discharge planning consulted to help with postop disposition and equipment needs.  Patient had a very difficult night on the evening of surgery due to moderate to severe pain but started to get up OOB with therapy on day one.  PCA Diluadid was continued but they were weaned over to PO meds.  Hemovac drain was pulled without difficulty.  Continued to work with therapy into day two.  Dressing was changed on day two and the incision was healing well.  Patient was seen in rounds and was much improved.  PCA was then discontinued and the patient worked with therapy again.  She did well that afternoon and was ready to go home on POD 2.   Discharge Medications: Prior to Admission medications   Medication Sig Start Date End Date Taking? Authorizing Provider  clonazePAM (KLONOPIN) 1 MG tablet Take 1 mg by mouth 3 (three) times daily as needed. anxiety   Yes Historical Provider, MD  fluticasone (FLONASE) 50 MCG/ACT nasal spray Place 2 sprays into the nose daily as needed. For congestion   Yes Historical Provider, MD  diazepam (VALIUM) 5 MG tablet Take 1 tablet (5 mg total) by mouth every 6 (six) hours as needed (spasms). 04/28/12   Webster Patrone Julien Girt, PA  HYDROcodone-acetaminophen (NORCO) 10-325 MG per tablet Take 1-2 tablets by mouth every 4 (four) hours as needed. 04/28/12   Saumya Hukill Julien Girt, PA  rivaroxaban (XARELTO) 10 MG TABS tablet Take 1 tablet (10 mg total) by mouth daily with breakfast. Take Xarelto for two and a half more weeks, then discontinue  Xarelto. 04/28/12   Miata Culbreth, PA    Diet: Cardiac diet Activity:WBAT Follow-up:in 2 weeks Disposition -  Home Discharged Condition: good   Discharge Orders    Future Orders Please Complete By Expires   Diet - low sodium heart healthy      Call MD / Call 911      Comments:   If you experience chest pain or shortness of breath, CALL 911 and be transported to the hospital emergency room.  If you develope a fever above 101 F, pus (Haberer drainage) or increased drainage or redness at the wound, or calf pain, call your surgeon's office.   Discharge instructions      Comments:   Pick up stool softner and laxative for home. Do not submerge incision under water. May shower. Continue to use ice for pain and swelling from surgery.  Take Xarelto for two and a half more weeks, then discontinue Xarelto.   Constipation Prevention      Comments:   Drink plenty of fluids.  Prune juice may be helpful.  You may use a stool softener, such as Colace (over the counter) 100 mg twice a day.  Use MiraLax (over the counter) for constipation as needed.   Increase activity slowly as tolerated      Patient may shower      Comments:   You may shower without a dressing once there is no drainage.  Do not wash over the wound.  If drainage remains, do not shower until drainage stops.   Weight bearing as tolerated      Driving restrictions      Comments:   No driving until released by the physician.   Lifting restrictions      Comments:   No lifting until released by the physician.   TED hose      Comments:   Use stockings (TED hose) for 3 weeks on both leg(s).  You may remove them at night for sleeping.   Change dressing      Comments:   Change dressing daily with sterile 4 x 4 inch gauze dressing and apply TED hose. Do not submerge the incision under water.   Do not put a pillow under the knee. Place it under the heel.      Do not sit on low chairs, stoools or toilet seats, as it may be difficult to get up from low surfaces          Medication List     As of 04/28/2012  8:44 AM    STOP taking these  medications         methocarbamol 500 MG tablet   Commonly known as: ROBAXIN      TAKE these medications         clonazePAM 1 MG tablet   Commonly known as: KLONOPIN   Take 1 mg by mouth 3 (three) times daily as needed. anxiety      diazepam 5 MG tablet   Commonly known as: VALIUM   Take 1 tablet (5 mg total) by mouth every 6 (six) hours as needed (spasms).      fluticasone 50 MCG/ACT nasal spray   Commonly known as: FLONASE   Place 2 sprays into the nose daily as needed. For congestion      HYDROcodone-acetaminophen 10-325 MG per tablet   Commonly known as: NORCO   Take 1-2 tablets by mouth every 4 (four) hours as needed.      rivaroxaban 10 MG Tabs  tablet   Commonly known as: XARELTO   Take 1 tablet (10 mg total) by mouth daily with breakfast. Take Xarelto for two and a half more weeks, then discontinue Xarelto.           Follow-up Information    Follow up with Loanne Drilling, MD. Schedule an appointment as soon as possible for a visit in 2 weeks.   Contact information:   [address], [address] [place] [address] [phone]          Signed: Patrica Duel 04/28/2012, 8:44 AM

## 2012-11-16 ENCOUNTER — Other Ambulatory Visit: Payer: Self-pay | Admitting: Obstetrics & Gynecology

## 2013-06-27 ENCOUNTER — Other Ambulatory Visit: Payer: Self-pay | Admitting: Pain Medicine

## 2013-06-27 DIAGNOSIS — M542 Cervicalgia: Secondary | ICD-10-CM

## 2013-06-29 ENCOUNTER — Other Ambulatory Visit: Payer: BC Managed Care – PPO

## 2013-06-30 ENCOUNTER — Other Ambulatory Visit: Payer: BC Managed Care – PPO

## 2013-07-13 ENCOUNTER — Other Ambulatory Visit: Payer: BC Managed Care – PPO

## 2013-07-18 ENCOUNTER — Other Ambulatory Visit: Payer: BC Managed Care – PPO

## 2013-07-27 ENCOUNTER — Ambulatory Visit
Admission: RE | Admit: 2013-07-27 | Discharge: 2013-07-27 | Disposition: A | Discharge status: Home / Self Care | Payer: 59 | Source: Ambulatory Visit | Attending: Pain Medicine | Admitting: Pain Medicine

## 2013-07-27 DIAGNOSIS — M542 Cervicalgia: Secondary | ICD-10-CM

## 2013-08-05 ENCOUNTER — Other Ambulatory Visit: Payer: 59

## 2013-08-31 ENCOUNTER — Other Ambulatory Visit: Payer: Self-pay | Admitting: Physician Assistant

## 2013-08-31 ENCOUNTER — Ambulatory Visit
Admission: RE | Admit: 2013-08-31 | Discharge: 2013-08-31 | Disposition: A | Discharge status: Home / Self Care | Payer: 59 | Source: Ambulatory Visit | Attending: Physician Assistant | Admitting: Physician Assistant

## 2013-08-31 DIAGNOSIS — R52 Pain, unspecified: Secondary | ICD-10-CM

## 2013-09-20 ENCOUNTER — Other Ambulatory Visit: Payer: Self-pay | Admitting: Pain Medicine

## 2013-09-20 ENCOUNTER — Ambulatory Visit
Admission: RE | Admit: 2013-09-20 | Discharge: 2013-09-20 | Disposition: A | Discharge status: Home / Self Care | Payer: 59 | Source: Ambulatory Visit | Attending: Pain Medicine | Admitting: Pain Medicine

## 2013-09-20 ENCOUNTER — Ambulatory Visit: Admission: RE | Admit: 2013-09-20 | Payer: 59 | Source: Ambulatory Visit

## 2013-09-20 DIAGNOSIS — M542 Cervicalgia: Secondary | ICD-10-CM

## 2013-11-28 ENCOUNTER — Other Ambulatory Visit: Payer: Self-pay | Admitting: Obstetrics & Gynecology

## 2013-11-29 LAB — CYTOLOGY - PAP

## 2014-09-27 ENCOUNTER — Other Ambulatory Visit (HOSPITAL_COMMUNITY): Payer: Self-pay | Admitting: Pain Medicine

## 2014-09-27 DIAGNOSIS — M545 Low back pain: Secondary | ICD-10-CM

## 2014-10-17 ENCOUNTER — Ambulatory Visit (HOSPITAL_COMMUNITY): Payer: BLUE CROSS/BLUE SHIELD

## 2014-11-10 ENCOUNTER — Other Ambulatory Visit: Payer: Self-pay | Admitting: Physician Assistant

## 2014-11-10 DIAGNOSIS — M545 Low back pain: Secondary | ICD-10-CM

## 2014-11-11 ENCOUNTER — Ambulatory Visit
Admission: RE | Admit: 2014-11-11 | Discharge: 2014-11-11 | Disposition: A | Discharge status: Home / Self Care | Payer: BLUE CROSS/BLUE SHIELD | Source: Ambulatory Visit | Attending: Physician Assistant | Admitting: Physician Assistant

## 2014-11-11 DIAGNOSIS — M545 Low back pain: Secondary | ICD-10-CM

## 2015-04-30 ENCOUNTER — Encounter (HOSPITAL_COMMUNITY): Payer: Self-pay | Admitting: Emergency Medicine

## 2015-04-30 ENCOUNTER — Emergency Department (HOSPITAL_COMMUNITY): Payer: BLUE CROSS/BLUE SHIELD

## 2015-04-30 ENCOUNTER — Emergency Department (HOSPITAL_COMMUNITY)
Admission: EM | Admit: 2015-04-30 | Discharge: 2015-04-30 | Disposition: A | Discharge status: Home / Self Care | Payer: BLUE CROSS/BLUE SHIELD | Attending: Emergency Medicine | Admitting: Emergency Medicine

## 2015-04-30 DIAGNOSIS — E669 Obesity, unspecified: Secondary | ICD-10-CM | POA: Diagnosis not present

## 2015-04-30 DIAGNOSIS — K219 Gastro-esophageal reflux disease without esophagitis: Secondary | ICD-10-CM | POA: Diagnosis not present

## 2015-04-30 DIAGNOSIS — R0602 Shortness of breath: Secondary | ICD-10-CM | POA: Diagnosis not present

## 2015-04-30 DIAGNOSIS — Z8701 Personal history of pneumonia (recurrent): Secondary | ICD-10-CM | POA: Diagnosis not present

## 2015-04-30 DIAGNOSIS — I10 Essential (primary) hypertension: Secondary | ICD-10-CM | POA: Insufficient documentation

## 2015-04-30 DIAGNOSIS — M1990 Unspecified osteoarthritis, unspecified site: Secondary | ICD-10-CM | POA: Diagnosis not present

## 2015-04-30 DIAGNOSIS — Z79899 Other long term (current) drug therapy: Secondary | ICD-10-CM | POA: Diagnosis not present

## 2015-04-30 DIAGNOSIS — F329 Major depressive disorder, single episode, unspecified: Secondary | ICD-10-CM | POA: Insufficient documentation

## 2015-04-30 DIAGNOSIS — R221 Localized swelling, mass and lump, neck: Secondary | ICD-10-CM | POA: Insufficient documentation

## 2015-04-30 DIAGNOSIS — Z8739 Personal history of other diseases of the musculoskeletal system and connective tissue: Secondary | ICD-10-CM | POA: Insufficient documentation

## 2015-04-30 DIAGNOSIS — F17210 Nicotine dependence, cigarettes, uncomplicated: Secondary | ICD-10-CM | POA: Insufficient documentation

## 2015-04-30 DIAGNOSIS — B370 Candidal stomatitis: Secondary | ICD-10-CM | POA: Diagnosis not present

## 2015-04-30 DIAGNOSIS — Z79891 Long term (current) use of opiate analgesic: Secondary | ICD-10-CM | POA: Diagnosis not present

## 2015-04-30 DIAGNOSIS — F419 Anxiety disorder, unspecified: Secondary | ICD-10-CM | POA: Insufficient documentation

## 2015-04-30 LAB — I-STAT TROPONIN, ED: Troponin i, poc: 0 ng/mL (ref 0.00–0.08)

## 2015-04-30 LAB — COMPREHENSIVE METABOLIC PANEL
ALK PHOS: 60 U/L (ref 38–126)
ALT: 33 U/L (ref 14–54)
ANION GAP: 10 (ref 5–15)
AST: 45 U/L — AB (ref 15–41)
Albumin: 3.8 g/dL (ref 3.5–5.0)
BUN: 10 mg/dL (ref 6–20)
CO2: 26 mmol/L (ref 22–32)
CREATININE: 0.73 mg/dL (ref 0.44–1.00)
Calcium: 9.2 mg/dL (ref 8.9–10.3)
Chloride: 104 mmol/L (ref 101–111)
GFR calc Af Amer: >60 mL/min (ref >60)
GFR calc non Af Amer: >60 mL/min (ref >60)
GLUCOSE: 132 mg/dL — AB (ref 65–99)
Potassium: 3.6 mmol/L (ref 3.5–5.1)
SODIUM: 140 mmol/L (ref 135–145)
Total Bilirubin: 1.1 mg/dL (ref 0.3–1.2)
Total Protein: 6.7 g/dL (ref 6.5–8.1)

## 2015-04-30 LAB — CBC WITH DIFFERENTIAL/PLATELET
BASOS ABS: 0 10*3/uL (ref 0.0–0.1)
BASOS PCT: 0 %
EOS ABS: 0.2 10*3/uL (ref 0.0–0.7)
Eosinophils Relative: 2 %
HCT: 41.4 % (ref 36.0–46.0)
HEMOGLOBIN: 14.3 g/dL (ref 12.0–15.0)
Lymphocytes Relative: 32 %
Lymphs Abs: 3.2 10*3/uL (ref 0.7–4.0)
MCH: 32.4 pg (ref 26.0–34.0)
MCHC: 34.5 g/dL (ref 30.0–36.0)
MCV: 93.9 fL (ref 78.0–100.0)
Monocytes Absolute: 0.6 10*3/uL (ref 0.1–1.0)
Monocytes Relative: 6 %
NEUTROS PCT: 60 %
Neutro Abs: 5.8 10*3/uL (ref 1.7–7.7)
Platelets: 182 10*3/uL (ref 150–400)
RBC: 4.41 MIL/uL (ref 3.87–5.11)
RDW: 13.1 % (ref 11.5–15.5)
WBC: 9.9 10*3/uL (ref 4.0–10.5)

## 2015-04-30 MED ORDER — FLUCONAZOLE 150 MG PO TABS: 150.0000 mg | ORAL_TABLET | Freq: Once | ORAL | Status: AC

## 2015-04-30 MED ORDER — IOHEXOL 300 MG/ML  SOLN
75.0000 mL | Freq: Once | INTRAMUSCULAR | Status: AC | PRN
  Administered 2015-04-30: 75 mL via INTRAVENOUS

## 2015-04-30 MED ORDER — DIPHENHYDRAMINE HCL 50 MG/ML IJ SOLN
50.0000 mg | Freq: Once | INTRAMUSCULAR | Status: AC
  Administered 2015-04-30: 50 mg via INTRAVENOUS
  Filled 2015-04-30: qty 1

## 2015-04-30 MED ORDER — ONDANSETRON HCL 4 MG/2ML IJ SOLN
4.0000 mg | Freq: Once | INTRAMUSCULAR | Status: AC
  Administered 2015-04-30: 4 mg via INTRAVENOUS
  Filled 2015-04-30: qty 2

## 2015-04-30 MED ORDER — FLUCONAZOLE 150 MG PO TABS
150.0000 mg | ORAL_TABLET | Freq: Once | ORAL | Status: AC
  Administered 2015-04-30: 150 mg via ORAL
  Filled 2015-04-30: qty 1

## 2015-04-30 MED ORDER — MORPHINE SULFATE (PF) 4 MG/ML IV SOLN
8.0000 mg | Freq: Once | INTRAVENOUS | Status: AC
  Administered 2015-04-30: 8 mg via INTRAVENOUS
  Filled 2015-04-30: qty 2

## 2015-04-30 NOTE — ED Notes (Signed)
Patient c/o tongue swelling, sore throat, weakness, nausea, difficulty swallowing. Recently started Embeda 1.5 weeks ago.

## 2015-04-30 NOTE — ED Notes (Signed)
Patient transported to CT 

## 2015-04-30 NOTE — ED Notes (Signed)
Pain medicine held. Patient falling asleep during conversation. MD notified.

## 2015-04-30 NOTE — Discharge Instructions (Signed)
You have mild oral thrush.   You are given diflucan in the ED.   Take another dose of diflucan in 2 days.   See your pain doctor. Discuss with him about switching pain medications.   Return to ER if you have worse pain, trouble breathing, neck swelling.
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
## 2015-05-02 ENCOUNTER — Telehealth (HOSPITAL_BASED_OUTPATIENT_CLINIC_OR_DEPARTMENT_OTHER): Payer: Self-pay | Admitting: Emergency Medicine
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
## 2015-08-02 IMAGING — CR DG THORACIC SPINE 3V
3 series · 3 of 3 positions shown · non-contrast
Comparison: Chest x-ray of 04/21/2012

CLINICAL DATA: Thoracic spine pain, numbness in the right arm and
hand, no trauma

EXAM:
THORACIC SPINE - 2 VIEW + SWIMMERS

[t t-spine a.p.]
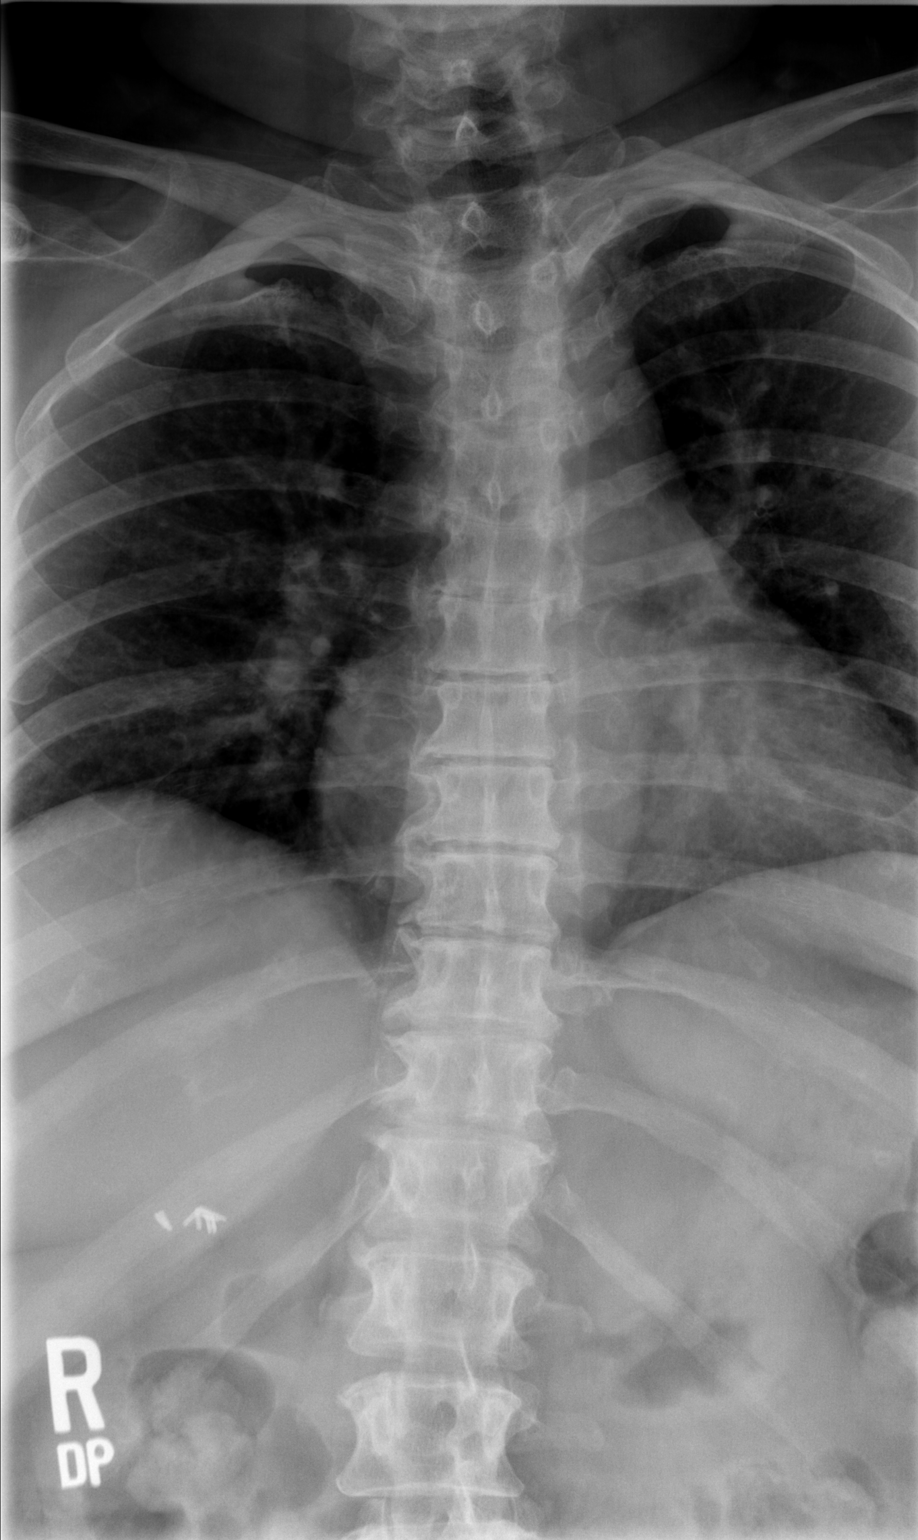

[t t-spine lat *]
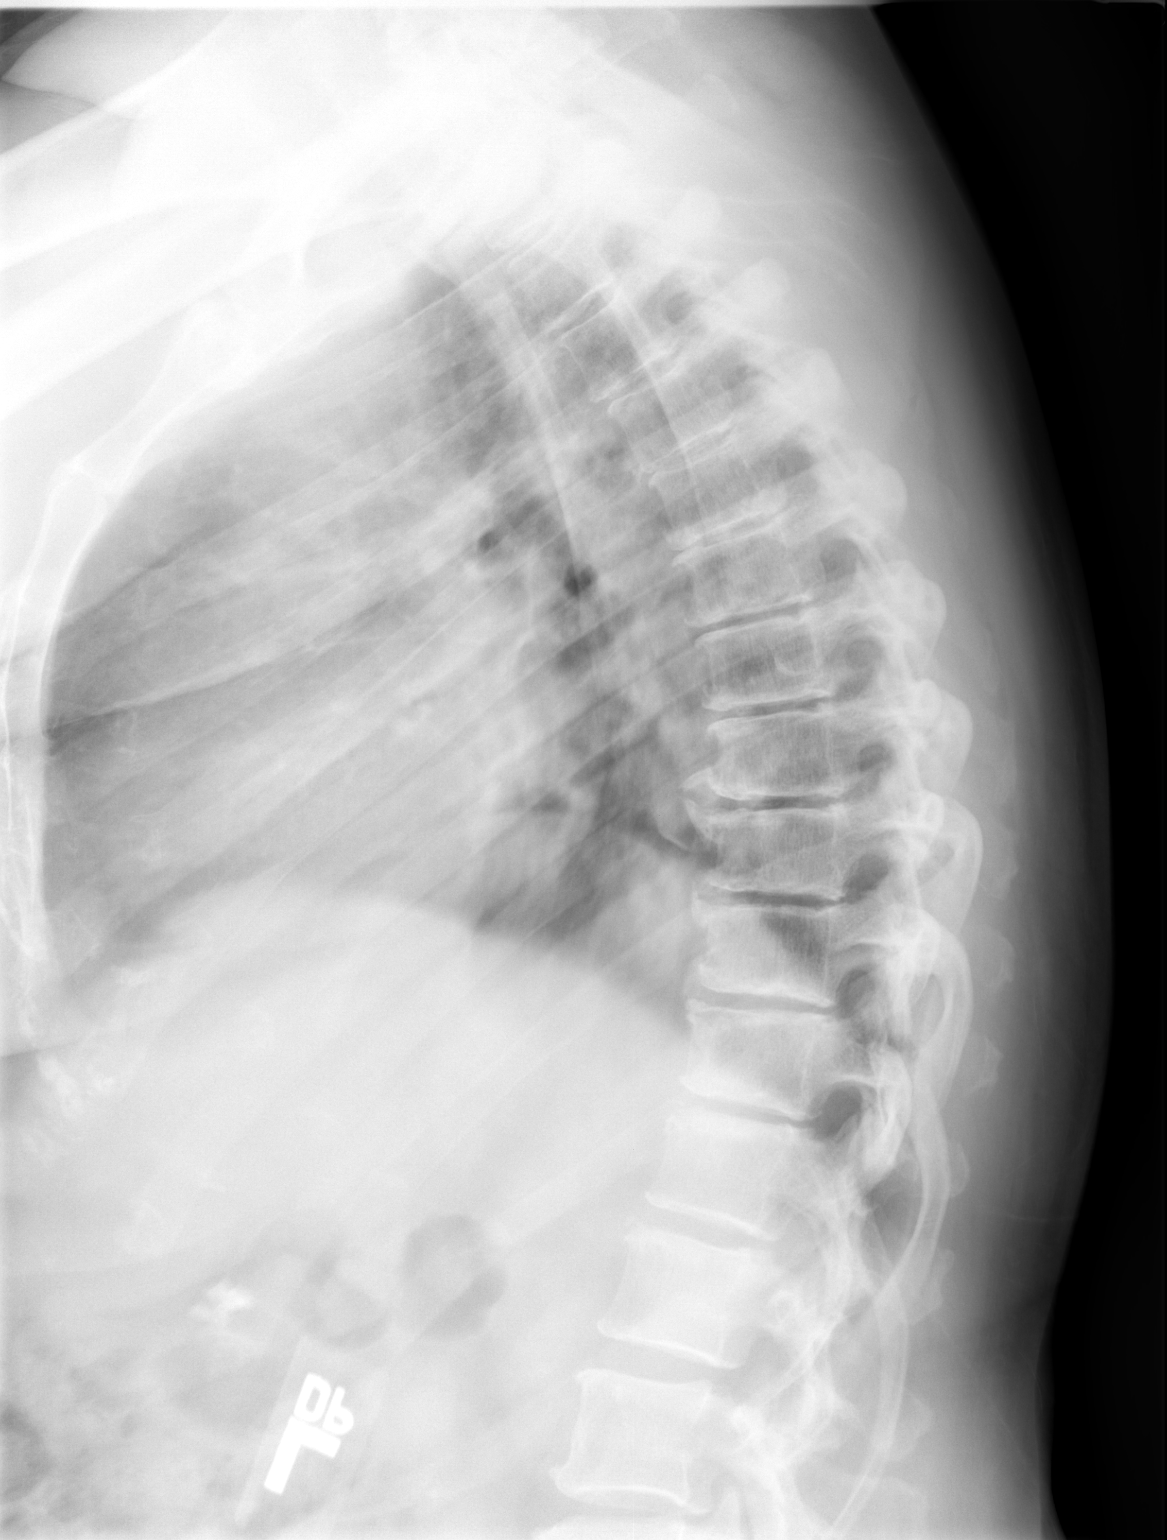

[t swimmers]
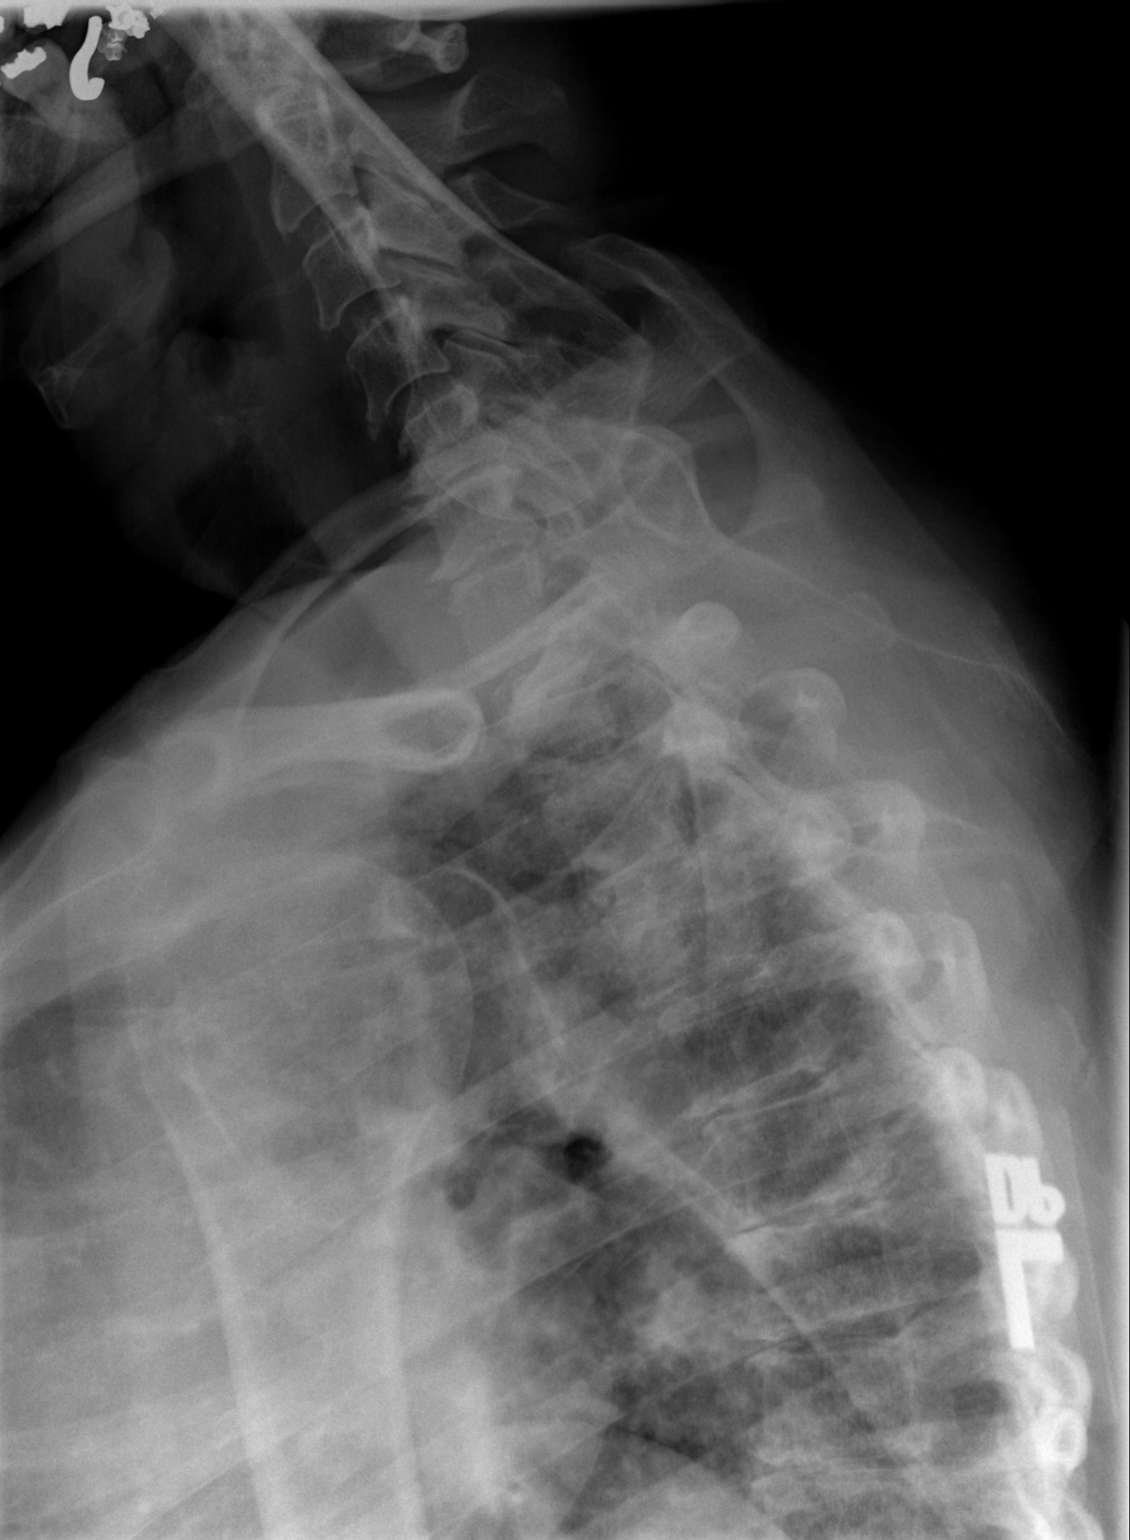

[3 of 3 positions shown; findings below may reference images not displayed]

FINDINGS: There is a mild thoracic kyphosis present. There are degenerative
changes in the mid to lower thoracic spine present. No compression
deformity is seen. No paravertebral soft tissue prominence is noted.
There is degenerative disc disease at C5-6 and C6-7 as well.
IMPRESSION: Diffuse degenerative change.  No compression deformity.

## 2016-06-12 ENCOUNTER — Ambulatory Visit
Admission: RE | Admit: 2016-06-12 | Discharge: 2016-06-12 | Disposition: A | Discharge status: Home / Self Care | Payer: BLUE CROSS/BLUE SHIELD | Source: Ambulatory Visit | Attending: Physician Assistant | Admitting: Physician Assistant

## 2016-06-12 ENCOUNTER — Other Ambulatory Visit: Payer: Self-pay | Admitting: Physician Assistant

## 2016-06-12 DIAGNOSIS — M25552 Pain in left hip: Secondary | ICD-10-CM

## 2016-07-24 DIAGNOSIS — F4542 Pain disorder with related psychological factors: Secondary | ICD-10-CM | POA: Diagnosis not present

## 2016-07-24 DIAGNOSIS — G609 Hereditary and idiopathic neuropathy, unspecified: Secondary | ICD-10-CM | POA: Diagnosis not present

## 2016-07-24 DIAGNOSIS — Z79899 Other long term (current) drug therapy: Secondary | ICD-10-CM | POA: Diagnosis not present

## 2016-07-24 DIAGNOSIS — G894 Chronic pain syndrome: Secondary | ICD-10-CM | POA: Diagnosis not present

## 2016-07-24 DIAGNOSIS — M792 Neuralgia and neuritis, unspecified: Secondary | ICD-10-CM | POA: Diagnosis not present

## 2016-07-24 DIAGNOSIS — Z79891 Long term (current) use of opiate analgesic: Secondary | ICD-10-CM | POA: Diagnosis not present

## 2016-08-06 DIAGNOSIS — M5137 Other intervertebral disc degeneration, lumbosacral region: Secondary | ICD-10-CM | POA: Diagnosis not present

## 2016-08-06 DIAGNOSIS — M542 Cervicalgia: Secondary | ICD-10-CM | POA: Diagnosis not present

## 2016-08-06 DIAGNOSIS — M47817 Spondylosis without myelopathy or radiculopathy, lumbosacral region: Secondary | ICD-10-CM | POA: Diagnosis not present

## 2016-08-06 DIAGNOSIS — G894 Chronic pain syndrome: Secondary | ICD-10-CM | POA: Diagnosis not present

## 2016-08-06 DIAGNOSIS — Z79891 Long term (current) use of opiate analgesic: Secondary | ICD-10-CM | POA: Diagnosis not present

## 2016-08-06 DIAGNOSIS — Z79899 Other long term (current) drug therapy: Secondary | ICD-10-CM | POA: Diagnosis not present

## 2016-09-03 DIAGNOSIS — G894 Chronic pain syndrome: Secondary | ICD-10-CM | POA: Diagnosis not present

## 2016-09-03 DIAGNOSIS — M47817 Spondylosis without myelopathy or radiculopathy, lumbosacral region: Secondary | ICD-10-CM | POA: Diagnosis not present

## 2016-09-03 DIAGNOSIS — M5137 Other intervertebral disc degeneration, lumbosacral region: Secondary | ICD-10-CM | POA: Diagnosis not present

## 2016-09-03 DIAGNOSIS — Z79899 Other long term (current) drug therapy: Secondary | ICD-10-CM | POA: Diagnosis not present

## 2016-09-03 DIAGNOSIS — M4692 Unspecified inflammatory spondylopathy, cervical region: Secondary | ICD-10-CM | POA: Diagnosis not present

## 2016-09-03 DIAGNOSIS — Z79891 Long term (current) use of opiate analgesic: Secondary | ICD-10-CM | POA: Diagnosis not present

## 2016-10-01 DIAGNOSIS — G894 Chronic pain syndrome: Secondary | ICD-10-CM | POA: Diagnosis not present

## 2016-10-01 DIAGNOSIS — M5137 Other intervertebral disc degeneration, lumbosacral region: Secondary | ICD-10-CM | POA: Diagnosis not present

## 2016-10-01 DIAGNOSIS — M47817 Spondylosis without myelopathy or radiculopathy, lumbosacral region: Secondary | ICD-10-CM | POA: Diagnosis not present

## 2016-10-01 DIAGNOSIS — M4692 Unspecified inflammatory spondylopathy, cervical region: Secondary | ICD-10-CM | POA: Diagnosis not present

## 2016-10-28 DIAGNOSIS — M5137 Other intervertebral disc degeneration, lumbosacral region: Secondary | ICD-10-CM | POA: Diagnosis not present

## 2016-10-28 DIAGNOSIS — G894 Chronic pain syndrome: Secondary | ICD-10-CM | POA: Diagnosis not present

## 2016-10-28 DIAGNOSIS — M4692 Unspecified inflammatory spondylopathy, cervical region: Secondary | ICD-10-CM | POA: Diagnosis not present

## 2016-10-28 DIAGNOSIS — Z79899 Other long term (current) drug therapy: Secondary | ICD-10-CM | POA: Diagnosis not present

## 2016-10-28 DIAGNOSIS — Z79891 Long term (current) use of opiate analgesic: Secondary | ICD-10-CM | POA: Diagnosis not present

## 2016-10-28 DIAGNOSIS — M47817 Spondylosis without myelopathy or radiculopathy, lumbosacral region: Secondary | ICD-10-CM | POA: Diagnosis not present

## 2016-11-25 DIAGNOSIS — Z79891 Long term (current) use of opiate analgesic: Secondary | ICD-10-CM | POA: Diagnosis not present

## 2016-11-25 DIAGNOSIS — M47817 Spondylosis without myelopathy or radiculopathy, lumbosacral region: Secondary | ICD-10-CM | POA: Diagnosis not present

## 2016-11-25 DIAGNOSIS — M5137 Other intervertebral disc degeneration, lumbosacral region: Secondary | ICD-10-CM | POA: Diagnosis not present

## 2016-11-25 DIAGNOSIS — Z79899 Other long term (current) drug therapy: Secondary | ICD-10-CM | POA: Diagnosis not present

## 2016-11-25 DIAGNOSIS — M4692 Unspecified inflammatory spondylopathy, cervical region: Secondary | ICD-10-CM | POA: Diagnosis not present

## 2016-11-25 DIAGNOSIS — G894 Chronic pain syndrome: Secondary | ICD-10-CM | POA: Diagnosis not present

## 2016-11-26 ENCOUNTER — Other Ambulatory Visit: Payer: Self-pay | Admitting: Pain Medicine

## 2016-11-26 DIAGNOSIS — M545 Low back pain: Secondary | ICD-10-CM

## 2016-12-10 ENCOUNTER — Ambulatory Visit
Admission: RE | Admit: 2016-12-10 | Discharge: 2016-12-10 | Disposition: A | Discharge status: Home / Self Care | Payer: BLUE CROSS/BLUE SHIELD | Source: Ambulatory Visit | Attending: Pain Medicine | Admitting: Pain Medicine

## 2016-12-10 DIAGNOSIS — M545 Low back pain: Secondary | ICD-10-CM

## 2016-12-10 DIAGNOSIS — M5126 Other intervertebral disc displacement, lumbar region: Secondary | ICD-10-CM | POA: Diagnosis not present

## 2016-12-26 DIAGNOSIS — Z86010 Personal history of colonic polyps: Secondary | ICD-10-CM | POA: Diagnosis not present

## 2016-12-26 DIAGNOSIS — E7800 Pure hypercholesterolemia, unspecified: Secondary | ICD-10-CM | POA: Diagnosis not present

## 2016-12-26 DIAGNOSIS — M4692 Unspecified inflammatory spondylopathy, cervical region: Secondary | ICD-10-CM | POA: Diagnosis not present

## 2016-12-26 DIAGNOSIS — R7301 Impaired fasting glucose: Secondary | ICD-10-CM | POA: Diagnosis not present

## 2016-12-26 DIAGNOSIS — G4730 Sleep apnea, unspecified: Secondary | ICD-10-CM | POA: Diagnosis not present

## 2016-12-26 DIAGNOSIS — G894 Chronic pain syndrome: Secondary | ICD-10-CM | POA: Diagnosis not present

## 2016-12-26 DIAGNOSIS — M47817 Spondylosis without myelopathy or radiculopathy, lumbosacral region: Secondary | ICD-10-CM | POA: Diagnosis not present

## 2016-12-26 DIAGNOSIS — M5137 Other intervertebral disc degeneration, lumbosacral region: Secondary | ICD-10-CM | POA: Diagnosis not present

## 2016-12-26 DIAGNOSIS — Z0000 Encounter for general adult medical examination without abnormal findings: Secondary | ICD-10-CM | POA: Diagnosis not present

## 2017-01-14 DIAGNOSIS — G8929 Other chronic pain: Secondary | ICD-10-CM | POA: Diagnosis not present

## 2017-01-14 DIAGNOSIS — M25562 Pain in left knee: Secondary | ICD-10-CM | POA: Diagnosis not present

## 2017-01-14 DIAGNOSIS — M25561 Pain in right knee: Secondary | ICD-10-CM | POA: Diagnosis not present

## 2017-01-16 ENCOUNTER — Other Ambulatory Visit (HOSPITAL_COMMUNITY): Payer: Self-pay

## 2017-01-16 ENCOUNTER — Other Ambulatory Visit (HOSPITAL_COMMUNITY): Payer: Self-pay | Admitting: Orthopedic Surgery

## 2017-01-16 DIAGNOSIS — M25561 Pain in right knee: Secondary | ICD-10-CM

## 2017-01-16 DIAGNOSIS — M25562 Pain in left knee: Principal | ICD-10-CM

## 2017-01-23 ENCOUNTER — Encounter (HOSPITAL_COMMUNITY): Payer: BLUE CROSS/BLUE SHIELD

## 2017-01-27 DIAGNOSIS — M50322 Other cervical disc degeneration at C5-C6 level: Secondary | ICD-10-CM | POA: Diagnosis not present

## 2017-01-27 DIAGNOSIS — M542 Cervicalgia: Secondary | ICD-10-CM | POA: Diagnosis not present

## 2017-01-27 DIAGNOSIS — M50323 Other cervical disc degeneration at C6-C7 level: Secondary | ICD-10-CM | POA: Diagnosis not present

## 2017-01-27 DIAGNOSIS — M545 Low back pain: Secondary | ICD-10-CM | POA: Diagnosis not present

## 2017-01-30 ENCOUNTER — Encounter (HOSPITAL_COMMUNITY): Payer: BLUE CROSS/BLUE SHIELD

## 2017-01-30 ENCOUNTER — Ambulatory Visit (HOSPITAL_COMMUNITY): Payer: BLUE CROSS/BLUE SHIELD

## 2017-01-30 ENCOUNTER — Encounter (HOSPITAL_COMMUNITY): Payer: Self-pay

## 2017-01-30 ENCOUNTER — Other Ambulatory Visit: Payer: Self-pay | Admitting: Gastroenterology

## 2017-01-30 DIAGNOSIS — M4692 Unspecified inflammatory spondylopathy, cervical region: Secondary | ICD-10-CM | POA: Diagnosis not present

## 2017-01-30 DIAGNOSIS — R1310 Dysphagia, unspecified: Secondary | ICD-10-CM

## 2017-01-30 DIAGNOSIS — Z79891 Long term (current) use of opiate analgesic: Secondary | ICD-10-CM | POA: Diagnosis not present

## 2017-01-30 DIAGNOSIS — Z79899 Other long term (current) drug therapy: Secondary | ICD-10-CM | POA: Diagnosis not present

## 2017-01-30 DIAGNOSIS — M5137 Other intervertebral disc degeneration, lumbosacral region: Secondary | ICD-10-CM | POA: Diagnosis not present

## 2017-01-30 DIAGNOSIS — M542 Cervicalgia: Secondary | ICD-10-CM | POA: Diagnosis not present

## 2017-01-30 DIAGNOSIS — G894 Chronic pain syndrome: Secondary | ICD-10-CM | POA: Diagnosis not present

## 2017-02-02 ENCOUNTER — Ambulatory Visit
Admission: RE | Admit: 2017-02-02 | Discharge: 2017-02-02 | Disposition: A | Discharge status: Home / Self Care | Payer: BLUE CROSS/BLUE SHIELD | Source: Ambulatory Visit | Attending: Gastroenterology | Admitting: Gastroenterology

## 2017-02-02 DIAGNOSIS — R1310 Dysphagia, unspecified: Secondary | ICD-10-CM

## 2017-02-03 DIAGNOSIS — M542 Cervicalgia: Secondary | ICD-10-CM | POA: Diagnosis not present

## 2017-02-05 DIAGNOSIS — D126 Benign neoplasm of colon, unspecified: Secondary | ICD-10-CM | POA: Diagnosis not present

## 2017-02-05 DIAGNOSIS — Z86010 Personal history of colonic polyps: Secondary | ICD-10-CM | POA: Diagnosis not present

## 2017-02-10 DIAGNOSIS — D126 Benign neoplasm of colon, unspecified: Secondary | ICD-10-CM | POA: Diagnosis not present

## 2017-02-19 DIAGNOSIS — T8482XD Fibrosis due to internal orthopedic prosthetic devices, implants and grafts, subsequent encounter: Secondary | ICD-10-CM | POA: Diagnosis not present

## 2017-02-24 DIAGNOSIS — M50323 Other cervical disc degeneration at C6-C7 level: Secondary | ICD-10-CM | POA: Diagnosis not present

## 2017-02-24 DIAGNOSIS — M50322 Other cervical disc degeneration at C5-C6 level: Secondary | ICD-10-CM | POA: Diagnosis not present

## 2017-03-03 DIAGNOSIS — M5137 Other intervertebral disc degeneration, lumbosacral region: Secondary | ICD-10-CM | POA: Diagnosis not present

## 2017-03-03 DIAGNOSIS — Z79899 Other long term (current) drug therapy: Secondary | ICD-10-CM | POA: Diagnosis not present

## 2017-03-03 DIAGNOSIS — M4692 Unspecified inflammatory spondylopathy, cervical region: Secondary | ICD-10-CM | POA: Diagnosis not present

## 2017-03-03 DIAGNOSIS — Z79891 Long term (current) use of opiate analgesic: Secondary | ICD-10-CM | POA: Diagnosis not present

## 2017-03-03 DIAGNOSIS — G894 Chronic pain syndrome: Secondary | ICD-10-CM | POA: Diagnosis not present

## 2017-03-03 DIAGNOSIS — M542 Cervicalgia: Secondary | ICD-10-CM | POA: Diagnosis not present

## 2017-03-17 DIAGNOSIS — M47817 Spondylosis without myelopathy or radiculopathy, lumbosacral region: Secondary | ICD-10-CM | POA: Diagnosis not present

## 2017-03-17 DIAGNOSIS — Z79891 Long term (current) use of opiate analgesic: Secondary | ICD-10-CM | POA: Diagnosis not present

## 2017-03-17 DIAGNOSIS — M4692 Unspecified inflammatory spondylopathy, cervical region: Secondary | ICD-10-CM | POA: Diagnosis not present

## 2017-03-17 DIAGNOSIS — M5137 Other intervertebral disc degeneration, lumbosacral region: Secondary | ICD-10-CM | POA: Diagnosis not present

## 2017-03-17 DIAGNOSIS — Z79899 Other long term (current) drug therapy: Secondary | ICD-10-CM | POA: Diagnosis not present

## 2017-03-17 DIAGNOSIS — G894 Chronic pain syndrome: Secondary | ICD-10-CM | POA: Diagnosis not present

## 2017-04-09 DIAGNOSIS — G894 Chronic pain syndrome: Secondary | ICD-10-CM | POA: Diagnosis not present

## 2017-04-09 DIAGNOSIS — M47812 Spondylosis without myelopathy or radiculopathy, cervical region: Secondary | ICD-10-CM | POA: Diagnosis not present

## 2017-04-09 DIAGNOSIS — M5137 Other intervertebral disc degeneration, lumbosacral region: Secondary | ICD-10-CM | POA: Diagnosis not present

## 2017-04-09 DIAGNOSIS — Z79899 Other long term (current) drug therapy: Secondary | ICD-10-CM | POA: Diagnosis not present

## 2017-04-09 DIAGNOSIS — M47817 Spondylosis without myelopathy or radiculopathy, lumbosacral region: Secondary | ICD-10-CM | POA: Diagnosis not present

## 2017-04-09 DIAGNOSIS — Z79891 Long term (current) use of opiate analgesic: Secondary | ICD-10-CM | POA: Diagnosis not present

## 2017-05-05 DIAGNOSIS — Z79891 Long term (current) use of opiate analgesic: Secondary | ICD-10-CM | POA: Diagnosis not present

## 2017-05-05 DIAGNOSIS — M47812 Spondylosis without myelopathy or radiculopathy, cervical region: Secondary | ICD-10-CM | POA: Diagnosis not present

## 2017-05-05 DIAGNOSIS — G894 Chronic pain syndrome: Secondary | ICD-10-CM | POA: Diagnosis not present

## 2017-05-05 DIAGNOSIS — Z79899 Other long term (current) drug therapy: Secondary | ICD-10-CM | POA: Diagnosis not present

## 2017-05-05 DIAGNOSIS — M5137 Other intervertebral disc degeneration, lumbosacral region: Secondary | ICD-10-CM | POA: Diagnosis not present

## 2017-05-05 DIAGNOSIS — M47817 Spondylosis without myelopathy or radiculopathy, lumbosacral region: Secondary | ICD-10-CM | POA: Diagnosis not present

## 2017-06-02 DIAGNOSIS — M5030 Other cervical disc degeneration, unspecified cervical region: Secondary | ICD-10-CM | POA: Diagnosis not present

## 2017-06-02 DIAGNOSIS — M5137 Other intervertebral disc degeneration, lumbosacral region: Secondary | ICD-10-CM | POA: Diagnosis not present

## 2017-06-02 DIAGNOSIS — Z79891 Long term (current) use of opiate analgesic: Secondary | ICD-10-CM | POA: Diagnosis not present

## 2017-06-02 DIAGNOSIS — Z79899 Other long term (current) drug therapy: Secondary | ICD-10-CM | POA: Diagnosis not present

## 2017-06-02 DIAGNOSIS — M47817 Spondylosis without myelopathy or radiculopathy, lumbosacral region: Secondary | ICD-10-CM | POA: Diagnosis not present

## 2017-06-02 DIAGNOSIS — G894 Chronic pain syndrome: Secondary | ICD-10-CM | POA: Diagnosis not present

## 2017-06-30 DIAGNOSIS — Z79899 Other long term (current) drug therapy: Secondary | ICD-10-CM | POA: Diagnosis not present

## 2017-06-30 DIAGNOSIS — M47817 Spondylosis without myelopathy or radiculopathy, lumbosacral region: Secondary | ICD-10-CM | POA: Diagnosis not present

## 2017-06-30 DIAGNOSIS — Z79891 Long term (current) use of opiate analgesic: Secondary | ICD-10-CM | POA: Diagnosis not present

## 2017-06-30 DIAGNOSIS — G894 Chronic pain syndrome: Secondary | ICD-10-CM | POA: Diagnosis not present

## 2017-06-30 DIAGNOSIS — M47812 Spondylosis without myelopathy or radiculopathy, cervical region: Secondary | ICD-10-CM | POA: Diagnosis not present

## 2017-07-28 DIAGNOSIS — M47812 Spondylosis without myelopathy or radiculopathy, cervical region: Secondary | ICD-10-CM | POA: Diagnosis not present

## 2017-07-28 DIAGNOSIS — Z79891 Long term (current) use of opiate analgesic: Secondary | ICD-10-CM | POA: Diagnosis not present

## 2017-07-28 DIAGNOSIS — M47817 Spondylosis without myelopathy or radiculopathy, lumbosacral region: Secondary | ICD-10-CM | POA: Diagnosis not present

## 2017-07-28 DIAGNOSIS — Z79899 Other long term (current) drug therapy: Secondary | ICD-10-CM | POA: Diagnosis not present

## 2017-07-28 DIAGNOSIS — G894 Chronic pain syndrome: Secondary | ICD-10-CM | POA: Diagnosis not present

## 2017-08-25 DIAGNOSIS — M47812 Spondylosis without myelopathy or radiculopathy, cervical region: Secondary | ICD-10-CM | POA: Diagnosis not present

## 2017-08-25 DIAGNOSIS — G894 Chronic pain syndrome: Secondary | ICD-10-CM | POA: Diagnosis not present

## 2017-08-25 DIAGNOSIS — M47817 Spondylosis without myelopathy or radiculopathy, lumbosacral region: Secondary | ICD-10-CM | POA: Diagnosis not present

## 2017-08-25 DIAGNOSIS — Z79899 Other long term (current) drug therapy: Secondary | ICD-10-CM | POA: Diagnosis not present

## 2017-08-25 DIAGNOSIS — Z79891 Long term (current) use of opiate analgesic: Secondary | ICD-10-CM | POA: Diagnosis not present

## 2017-09-14 DIAGNOSIS — M5030 Other cervical disc degeneration, unspecified cervical region: Secondary | ICD-10-CM | POA: Diagnosis not present

## 2017-09-28 DIAGNOSIS — M47817 Spondylosis without myelopathy or radiculopathy, lumbosacral region: Secondary | ICD-10-CM | POA: Diagnosis not present

## 2017-09-28 DIAGNOSIS — M47812 Spondylosis without myelopathy or radiculopathy, cervical region: Secondary | ICD-10-CM | POA: Diagnosis not present

## 2017-09-28 DIAGNOSIS — G894 Chronic pain syndrome: Secondary | ICD-10-CM | POA: Diagnosis not present

## 2017-09-28 DIAGNOSIS — Z79891 Long term (current) use of opiate analgesic: Secondary | ICD-10-CM | POA: Diagnosis not present

## 2017-09-28 DIAGNOSIS — Z79899 Other long term (current) drug therapy: Secondary | ICD-10-CM | POA: Diagnosis not present

## 2017-10-26 DIAGNOSIS — M47812 Spondylosis without myelopathy or radiculopathy, cervical region: Secondary | ICD-10-CM | POA: Diagnosis not present

## 2017-10-26 DIAGNOSIS — G894 Chronic pain syndrome: Secondary | ICD-10-CM | POA: Diagnosis not present

## 2017-10-26 DIAGNOSIS — M47817 Spondylosis without myelopathy or radiculopathy, lumbosacral region: Secondary | ICD-10-CM | POA: Diagnosis not present

## 2017-11-02 DIAGNOSIS — M47817 Spondylosis without myelopathy or radiculopathy, lumbosacral region: Secondary | ICD-10-CM | POA: Diagnosis not present

## 2017-11-03 DIAGNOSIS — M5412 Radiculopathy, cervical region: Secondary | ICD-10-CM | POA: Diagnosis not present

## 2017-11-23 DIAGNOSIS — G894 Chronic pain syndrome: Secondary | ICD-10-CM | POA: Diagnosis not present

## 2017-11-23 DIAGNOSIS — Z79891 Long term (current) use of opiate analgesic: Secondary | ICD-10-CM | POA: Diagnosis not present

## 2017-11-23 DIAGNOSIS — M47817 Spondylosis without myelopathy or radiculopathy, lumbosacral region: Secondary | ICD-10-CM | POA: Diagnosis not present

## 2017-11-23 DIAGNOSIS — Z79899 Other long term (current) drug therapy: Secondary | ICD-10-CM | POA: Diagnosis not present

## 2017-11-23 DIAGNOSIS — M5137 Other intervertebral disc degeneration, lumbosacral region: Secondary | ICD-10-CM | POA: Diagnosis not present

## 2017-12-02 DIAGNOSIS — M47817 Spondylosis without myelopathy or radiculopathy, lumbosacral region: Secondary | ICD-10-CM | POA: Diagnosis not present

## 2017-12-23 DIAGNOSIS — M47812 Spondylosis without myelopathy or radiculopathy, cervical region: Secondary | ICD-10-CM | POA: Diagnosis not present

## 2017-12-23 DIAGNOSIS — G894 Chronic pain syndrome: Secondary | ICD-10-CM | POA: Diagnosis not present

## 2017-12-23 DIAGNOSIS — M47817 Spondylosis without myelopathy or radiculopathy, lumbosacral region: Secondary | ICD-10-CM | POA: Diagnosis not present

## 2018-01-20 DIAGNOSIS — M47817 Spondylosis without myelopathy or radiculopathy, lumbosacral region: Secondary | ICD-10-CM | POA: Diagnosis not present

## 2018-01-20 DIAGNOSIS — M5412 Radiculopathy, cervical region: Secondary | ICD-10-CM | POA: Diagnosis not present

## 2018-01-20 DIAGNOSIS — Z79891 Long term (current) use of opiate analgesic: Secondary | ICD-10-CM | POA: Diagnosis not present

## 2018-01-20 DIAGNOSIS — G894 Chronic pain syndrome: Secondary | ICD-10-CM | POA: Diagnosis not present

## 2018-01-20 DIAGNOSIS — Z79899 Other long term (current) drug therapy: Secondary | ICD-10-CM | POA: Diagnosis not present

## 2018-01-20 DIAGNOSIS — M5030 Other cervical disc degeneration, unspecified cervical region: Secondary | ICD-10-CM | POA: Diagnosis not present

## 2018-02-18 DIAGNOSIS — M5137 Other intervertebral disc degeneration, lumbosacral region: Secondary | ICD-10-CM | POA: Diagnosis not present

## 2018-02-18 DIAGNOSIS — G894 Chronic pain syndrome: Secondary | ICD-10-CM | POA: Diagnosis not present

## 2018-02-18 DIAGNOSIS — Z79899 Other long term (current) drug therapy: Secondary | ICD-10-CM | POA: Diagnosis not present

## 2018-02-18 DIAGNOSIS — Z79891 Long term (current) use of opiate analgesic: Secondary | ICD-10-CM | POA: Diagnosis not present

## 2018-02-18 DIAGNOSIS — M47817 Spondylosis without myelopathy or radiculopathy, lumbosacral region: Secondary | ICD-10-CM | POA: Diagnosis not present

## 2018-03-18 DIAGNOSIS — M47817 Spondylosis without myelopathy or radiculopathy, lumbosacral region: Secondary | ICD-10-CM | POA: Diagnosis not present

## 2018-03-18 DIAGNOSIS — M5137 Other intervertebral disc degeneration, lumbosacral region: Secondary | ICD-10-CM | POA: Diagnosis not present

## 2018-03-18 DIAGNOSIS — G894 Chronic pain syndrome: Secondary | ICD-10-CM | POA: Diagnosis not present

## 2018-04-06 DIAGNOSIS — G609 Hereditary and idiopathic neuropathy, unspecified: Secondary | ICD-10-CM | POA: Diagnosis not present

## 2018-04-06 DIAGNOSIS — G894 Chronic pain syndrome: Secondary | ICD-10-CM | POA: Diagnosis not present

## 2018-04-06 DIAGNOSIS — M792 Neuralgia and neuritis, unspecified: Secondary | ICD-10-CM | POA: Diagnosis not present

## 2018-04-08 DIAGNOSIS — G894 Chronic pain syndrome: Secondary | ICD-10-CM | POA: Diagnosis not present

## 2018-04-08 DIAGNOSIS — H9313 Tinnitus, bilateral: Secondary | ICD-10-CM | POA: Diagnosis not present

## 2018-04-08 DIAGNOSIS — Z23 Encounter for immunization: Secondary | ICD-10-CM | POA: Diagnosis not present

## 2018-04-15 DIAGNOSIS — Z79891 Long term (current) use of opiate analgesic: Secondary | ICD-10-CM | POA: Diagnosis not present

## 2018-04-15 DIAGNOSIS — M5137 Other intervertebral disc degeneration, lumbosacral region: Secondary | ICD-10-CM | POA: Diagnosis not present

## 2018-04-15 DIAGNOSIS — M47817 Spondylosis without myelopathy or radiculopathy, lumbosacral region: Secondary | ICD-10-CM | POA: Diagnosis not present

## 2018-04-15 DIAGNOSIS — Z79899 Other long term (current) drug therapy: Secondary | ICD-10-CM | POA: Diagnosis not present

## 2018-04-15 DIAGNOSIS — G894 Chronic pain syndrome: Secondary | ICD-10-CM | POA: Diagnosis not present

## 2018-05-04 DIAGNOSIS — E7800 Pure hypercholesterolemia, unspecified: Secondary | ICD-10-CM | POA: Diagnosis not present

## 2018-05-04 DIAGNOSIS — G4730 Sleep apnea, unspecified: Secondary | ICD-10-CM | POA: Diagnosis not present

## 2018-05-04 DIAGNOSIS — Z0000 Encounter for general adult medical examination without abnormal findings: Secondary | ICD-10-CM | POA: Diagnosis not present

## 2018-05-10 DIAGNOSIS — G894 Chronic pain syndrome: Secondary | ICD-10-CM | POA: Diagnosis not present

## 2018-05-10 DIAGNOSIS — M47817 Spondylosis without myelopathy or radiculopathy, lumbosacral region: Secondary | ICD-10-CM | POA: Diagnosis not present

## 2018-05-10 DIAGNOSIS — M47812 Spondylosis without myelopathy or radiculopathy, cervical region: Secondary | ICD-10-CM | POA: Diagnosis not present

## 2018-06-07 DIAGNOSIS — M47817 Spondylosis without myelopathy or radiculopathy, lumbosacral region: Secondary | ICD-10-CM | POA: Diagnosis not present

## 2018-06-10 DIAGNOSIS — M5030 Other cervical disc degeneration, unspecified cervical region: Secondary | ICD-10-CM | POA: Diagnosis not present

## 2018-06-10 DIAGNOSIS — Z79891 Long term (current) use of opiate analgesic: Secondary | ICD-10-CM | POA: Diagnosis not present

## 2018-06-10 DIAGNOSIS — Z79899 Other long term (current) drug therapy: Secondary | ICD-10-CM | POA: Diagnosis not present

## 2018-06-10 DIAGNOSIS — M47817 Spondylosis without myelopathy or radiculopathy, lumbosacral region: Secondary | ICD-10-CM | POA: Diagnosis not present

## 2018-06-10 DIAGNOSIS — M5137 Other intervertebral disc degeneration, lumbosacral region: Secondary | ICD-10-CM | POA: Diagnosis not present

## 2018-06-10 DIAGNOSIS — G894 Chronic pain syndrome: Secondary | ICD-10-CM | POA: Diagnosis not present

## 2018-07-08 DIAGNOSIS — M5137 Other intervertebral disc degeneration, lumbosacral region: Secondary | ICD-10-CM | POA: Diagnosis not present

## 2018-07-08 DIAGNOSIS — M47817 Spondylosis without myelopathy or radiculopathy, lumbosacral region: Secondary | ICD-10-CM | POA: Diagnosis not present

## 2018-07-08 DIAGNOSIS — G894 Chronic pain syndrome: Secondary | ICD-10-CM | POA: Diagnosis not present

## 2018-07-08 DIAGNOSIS — M47812 Spondylosis without myelopathy or radiculopathy, cervical region: Secondary | ICD-10-CM | POA: Diagnosis not present

## 2018-08-05 DIAGNOSIS — M47817 Spondylosis without myelopathy or radiculopathy, lumbosacral region: Secondary | ICD-10-CM | POA: Diagnosis not present

## 2018-08-05 DIAGNOSIS — G894 Chronic pain syndrome: Secondary | ICD-10-CM | POA: Diagnosis not present

## 2018-08-05 DIAGNOSIS — M47812 Spondylosis without myelopathy or radiculopathy, cervical region: Secondary | ICD-10-CM | POA: Diagnosis not present

## 2018-08-05 DIAGNOSIS — M5137 Other intervertebral disc degeneration, lumbosacral region: Secondary | ICD-10-CM | POA: Diagnosis not present

## 2018-08-05 DIAGNOSIS — Z79899 Other long term (current) drug therapy: Secondary | ICD-10-CM | POA: Diagnosis not present

## 2018-08-05 DIAGNOSIS — Z79891 Long term (current) use of opiate analgesic: Secondary | ICD-10-CM | POA: Diagnosis not present

## 2018-09-02 DIAGNOSIS — M47817 Spondylosis without myelopathy or radiculopathy, lumbosacral region: Secondary | ICD-10-CM | POA: Diagnosis not present

## 2018-09-02 DIAGNOSIS — G894 Chronic pain syndrome: Secondary | ICD-10-CM | POA: Diagnosis not present

## 2018-09-02 DIAGNOSIS — M47812 Spondylosis without myelopathy or radiculopathy, cervical region: Secondary | ICD-10-CM | POA: Diagnosis not present

## 2018-09-02 DIAGNOSIS — M5030 Other cervical disc degeneration, unspecified cervical region: Secondary | ICD-10-CM | POA: Diagnosis not present

## 2018-10-01 DIAGNOSIS — M5137 Other intervertebral disc degeneration, lumbosacral region: Secondary | ICD-10-CM | POA: Diagnosis not present

## 2018-10-01 DIAGNOSIS — M47817 Spondylosis without myelopathy or radiculopathy, lumbosacral region: Secondary | ICD-10-CM | POA: Diagnosis not present

## 2018-10-01 DIAGNOSIS — G894 Chronic pain syndrome: Secondary | ICD-10-CM | POA: Diagnosis not present

## 2018-10-01 DIAGNOSIS — M5030 Other cervical disc degeneration, unspecified cervical region: Secondary | ICD-10-CM | POA: Diagnosis not present

## 2018-10-29 DIAGNOSIS — M5137 Other intervertebral disc degeneration, lumbosacral region: Secondary | ICD-10-CM | POA: Diagnosis not present

## 2018-10-29 DIAGNOSIS — Z79891 Long term (current) use of opiate analgesic: Secondary | ICD-10-CM | POA: Diagnosis not present

## 2018-10-29 DIAGNOSIS — M47817 Spondylosis without myelopathy or radiculopathy, lumbosacral region: Secondary | ICD-10-CM | POA: Diagnosis not present

## 2018-10-29 DIAGNOSIS — Z79899 Other long term (current) drug therapy: Secondary | ICD-10-CM | POA: Diagnosis not present

## 2018-10-29 DIAGNOSIS — G894 Chronic pain syndrome: Secondary | ICD-10-CM | POA: Diagnosis not present

## 2018-10-29 DIAGNOSIS — M5030 Other cervical disc degeneration, unspecified cervical region: Secondary | ICD-10-CM | POA: Diagnosis not present

## 2018-11-01 DIAGNOSIS — Z79891 Long term (current) use of opiate analgesic: Secondary | ICD-10-CM | POA: Diagnosis not present

## 2018-11-01 DIAGNOSIS — Z79899 Other long term (current) drug therapy: Secondary | ICD-10-CM | POA: Diagnosis not present

## 2018-11-01 DIAGNOSIS — G894 Chronic pain syndrome: Secondary | ICD-10-CM | POA: Diagnosis not present

## 2018-11-12 DIAGNOSIS — E7800 Pure hypercholesterolemia, unspecified: Secondary | ICD-10-CM | POA: Diagnosis not present

## 2018-11-12 DIAGNOSIS — F419 Anxiety disorder, unspecified: Secondary | ICD-10-CM | POA: Diagnosis not present

## 2018-11-12 DIAGNOSIS — F17210 Nicotine dependence, cigarettes, uncomplicated: Secondary | ICD-10-CM | POA: Diagnosis not present

## 2018-11-12 DIAGNOSIS — G894 Chronic pain syndrome: Secondary | ICD-10-CM | POA: Diagnosis not present

## 2018-12-02 DIAGNOSIS — M5137 Other intervertebral disc degeneration, lumbosacral region: Secondary | ICD-10-CM | POA: Diagnosis not present

## 2018-12-02 DIAGNOSIS — M47817 Spondylosis without myelopathy or radiculopathy, lumbosacral region: Secondary | ICD-10-CM | POA: Diagnosis not present

## 2018-12-02 DIAGNOSIS — G894 Chronic pain syndrome: Secondary | ICD-10-CM | POA: Diagnosis not present

## 2018-12-02 DIAGNOSIS — M47812 Spondylosis without myelopathy or radiculopathy, cervical region: Secondary | ICD-10-CM | POA: Diagnosis not present

## 2018-12-29 DIAGNOSIS — M5412 Radiculopathy, cervical region: Secondary | ICD-10-CM | POA: Diagnosis not present

## 2018-12-29 DIAGNOSIS — M5030 Other cervical disc degeneration, unspecified cervical region: Secondary | ICD-10-CM | POA: Diagnosis not present

## 2019-01-12 DIAGNOSIS — M47817 Spondylosis without myelopathy or radiculopathy, lumbosacral region: Secondary | ICD-10-CM | POA: Diagnosis not present

## 2019-02-03 ENCOUNTER — Other Ambulatory Visit: Payer: Self-pay | Admitting: Physician Assistant

## 2019-02-03 ENCOUNTER — Ambulatory Visit
Admission: RE | Admit: 2019-02-03 | Discharge: 2019-02-03 | Disposition: A | Discharge status: Home / Self Care | Payer: BLUE CROSS/BLUE SHIELD | Source: Ambulatory Visit | Attending: Physician Assistant | Admitting: Physician Assistant

## 2019-02-03 DIAGNOSIS — M47817 Spondylosis without myelopathy or radiculopathy, lumbosacral region: Secondary | ICD-10-CM | POA: Diagnosis not present

## 2019-02-03 DIAGNOSIS — M25559 Pain in unspecified hip: Secondary | ICD-10-CM | POA: Diagnosis not present

## 2019-02-03 DIAGNOSIS — M25551 Pain in right hip: Secondary | ICD-10-CM

## 2019-02-03 DIAGNOSIS — G894 Chronic pain syndrome: Secondary | ICD-10-CM | POA: Diagnosis not present

## 2019-02-03 DIAGNOSIS — Z79891 Long term (current) use of opiate analgesic: Secondary | ICD-10-CM | POA: Diagnosis not present

## 2019-02-03 DIAGNOSIS — M1611 Unilateral primary osteoarthritis, right hip: Secondary | ICD-10-CM | POA: Diagnosis not present

## 2019-02-03 DIAGNOSIS — M47812 Spondylosis without myelopathy or radiculopathy, cervical region: Secondary | ICD-10-CM | POA: Diagnosis not present

## 2019-02-03 DIAGNOSIS — Z79899 Other long term (current) drug therapy: Secondary | ICD-10-CM | POA: Diagnosis not present

## 2019-03-03 DIAGNOSIS — G894 Chronic pain syndrome: Secondary | ICD-10-CM | POA: Diagnosis not present

## 2019-03-03 DIAGNOSIS — M47812 Spondylosis without myelopathy or radiculopathy, cervical region: Secondary | ICD-10-CM | POA: Diagnosis not present

## 2019-03-03 DIAGNOSIS — M25559 Pain in unspecified hip: Secondary | ICD-10-CM | POA: Diagnosis not present

## 2019-03-03 DIAGNOSIS — M47817 Spondylosis without myelopathy or radiculopathy, lumbosacral region: Secondary | ICD-10-CM | POA: Diagnosis not present

## 2019-03-31 DIAGNOSIS — Z79899 Other long term (current) drug therapy: Secondary | ICD-10-CM | POA: Diagnosis not present

## 2019-03-31 DIAGNOSIS — M47817 Spondylosis without myelopathy or radiculopathy, lumbosacral region: Secondary | ICD-10-CM | POA: Diagnosis not present

## 2019-03-31 DIAGNOSIS — Z79891 Long term (current) use of opiate analgesic: Secondary | ICD-10-CM | POA: Diagnosis not present

## 2019-03-31 DIAGNOSIS — M47812 Spondylosis without myelopathy or radiculopathy, cervical region: Secondary | ICD-10-CM | POA: Diagnosis not present

## 2019-03-31 DIAGNOSIS — M25559 Pain in unspecified hip: Secondary | ICD-10-CM | POA: Diagnosis not present

## 2019-03-31 DIAGNOSIS — G894 Chronic pain syndrome: Secondary | ICD-10-CM | POA: Diagnosis not present

## 2019-04-25 DIAGNOSIS — G894 Chronic pain syndrome: Secondary | ICD-10-CM | POA: Diagnosis not present

## 2019-04-25 DIAGNOSIS — M1611 Unilateral primary osteoarthritis, right hip: Secondary | ICD-10-CM | POA: Diagnosis not present

## 2019-04-25 DIAGNOSIS — M5136 Other intervertebral disc degeneration, lumbar region: Secondary | ICD-10-CM | POA: Diagnosis not present

## 2019-04-25 DIAGNOSIS — Z79891 Long term (current) use of opiate analgesic: Secondary | ICD-10-CM | POA: Diagnosis not present

## 2019-04-25 DIAGNOSIS — M5030 Other cervical disc degeneration, unspecified cervical region: Secondary | ICD-10-CM | POA: Diagnosis not present

## 2019-04-25 DIAGNOSIS — Z79899 Other long term (current) drug therapy: Secondary | ICD-10-CM | POA: Diagnosis not present

## 2019-05-23 DIAGNOSIS — M1611 Unilateral primary osteoarthritis, right hip: Secondary | ICD-10-CM | POA: Diagnosis not present

## 2019-05-23 DIAGNOSIS — M5136 Other intervertebral disc degeneration, lumbar region: Secondary | ICD-10-CM | POA: Diagnosis not present

## 2019-05-23 DIAGNOSIS — M5030 Other cervical disc degeneration, unspecified cervical region: Secondary | ICD-10-CM | POA: Diagnosis not present

## 2019-05-23 DIAGNOSIS — G894 Chronic pain syndrome: Secondary | ICD-10-CM | POA: Diagnosis not present

## 2019-05-24 DIAGNOSIS — H9313 Tinnitus, bilateral: Secondary | ICD-10-CM | POA: Diagnosis not present

## 2019-05-24 DIAGNOSIS — R7303 Prediabetes: Secondary | ICD-10-CM | POA: Diagnosis not present

## 2019-05-24 DIAGNOSIS — G4730 Sleep apnea, unspecified: Secondary | ICD-10-CM | POA: Diagnosis not present

## 2019-05-24 DIAGNOSIS — E7800 Pure hypercholesterolemia, unspecified: Secondary | ICD-10-CM | POA: Diagnosis not present

## 2019-05-24 DIAGNOSIS — Z0000 Encounter for general adult medical examination without abnormal findings: Secondary | ICD-10-CM | POA: Diagnosis not present

## 2019-05-24 DIAGNOSIS — F419 Anxiety disorder, unspecified: Secondary | ICD-10-CM | POA: Diagnosis not present

## 2019-05-24 DIAGNOSIS — Z23 Encounter for immunization: Secondary | ICD-10-CM | POA: Diagnosis not present

## 2019-07-04 DIAGNOSIS — M5030 Other cervical disc degeneration, unspecified cervical region: Secondary | ICD-10-CM | POA: Diagnosis not present

## 2019-07-04 DIAGNOSIS — M5136 Other intervertebral disc degeneration, lumbar region: Secondary | ICD-10-CM | POA: Diagnosis not present

## 2019-07-04 DIAGNOSIS — G894 Chronic pain syndrome: Secondary | ICD-10-CM | POA: Diagnosis not present

## 2019-07-04 DIAGNOSIS — M47817 Spondylosis without myelopathy or radiculopathy, lumbosacral region: Secondary | ICD-10-CM | POA: Diagnosis not present

## 2019-08-01 DIAGNOSIS — M1611 Unilateral primary osteoarthritis, right hip: Secondary | ICD-10-CM | POA: Diagnosis not present

## 2019-08-01 DIAGNOSIS — Z79891 Long term (current) use of opiate analgesic: Secondary | ICD-10-CM | POA: Diagnosis not present

## 2019-08-01 DIAGNOSIS — M47817 Spondylosis without myelopathy or radiculopathy, lumbosacral region: Secondary | ICD-10-CM | POA: Diagnosis not present

## 2019-08-01 DIAGNOSIS — Z79899 Other long term (current) drug therapy: Secondary | ICD-10-CM | POA: Diagnosis not present

## 2019-08-01 DIAGNOSIS — M5030 Other cervical disc degeneration, unspecified cervical region: Secondary | ICD-10-CM | POA: Diagnosis not present

## 2019-08-01 DIAGNOSIS — G894 Chronic pain syndrome: Secondary | ICD-10-CM | POA: Diagnosis not present

## 2019-08-01 DIAGNOSIS — M5136 Other intervertebral disc degeneration, lumbar region: Secondary | ICD-10-CM | POA: Diagnosis not present

## 2019-09-08 DIAGNOSIS — M5030 Other cervical disc degeneration, unspecified cervical region: Secondary | ICD-10-CM | POA: Diagnosis not present

## 2019-09-08 DIAGNOSIS — M47817 Spondylosis without myelopathy or radiculopathy, lumbosacral region: Secondary | ICD-10-CM | POA: Diagnosis not present

## 2019-09-08 DIAGNOSIS — M5137 Other intervertebral disc degeneration, lumbosacral region: Secondary | ICD-10-CM | POA: Diagnosis not present

## 2019-09-08 DIAGNOSIS — M25559 Pain in unspecified hip: Secondary | ICD-10-CM | POA: Diagnosis not present

## 2019-09-08 DIAGNOSIS — M47812 Spondylosis without myelopathy or radiculopathy, cervical region: Secondary | ICD-10-CM | POA: Diagnosis not present

## 2019-09-08 DIAGNOSIS — G894 Chronic pain syndrome: Secondary | ICD-10-CM | POA: Diagnosis not present

## 2019-09-19 DIAGNOSIS — M47817 Spondylosis without myelopathy or radiculopathy, lumbosacral region: Secondary | ICD-10-CM | POA: Diagnosis not present

## 2019-09-19 DIAGNOSIS — M545 Low back pain: Secondary | ICD-10-CM | POA: Diagnosis not present

## 2019-09-19 DIAGNOSIS — M5136 Other intervertebral disc degeneration, lumbar region: Secondary | ICD-10-CM | POA: Diagnosis not present

## 2019-10-17 DIAGNOSIS — M47817 Spondylosis without myelopathy or radiculopathy, lumbosacral region: Secondary | ICD-10-CM | POA: Diagnosis not present

## 2019-10-17 DIAGNOSIS — M47812 Spondylosis without myelopathy or radiculopathy, cervical region: Secondary | ICD-10-CM | POA: Diagnosis not present

## 2019-10-17 DIAGNOSIS — Z79899 Other long term (current) drug therapy: Secondary | ICD-10-CM | POA: Diagnosis not present

## 2019-10-17 DIAGNOSIS — Z79891 Long term (current) use of opiate analgesic: Secondary | ICD-10-CM | POA: Diagnosis not present

## 2019-10-17 DIAGNOSIS — G894 Chronic pain syndrome: Secondary | ICD-10-CM | POA: Diagnosis not present

## 2019-10-17 DIAGNOSIS — M25559 Pain in unspecified hip: Secondary | ICD-10-CM | POA: Diagnosis not present

## 2019-11-03 DIAGNOSIS — F1120 Opioid dependence, uncomplicated: Secondary | ICD-10-CM | POA: Diagnosis not present

## 2019-11-03 DIAGNOSIS — Z79891 Long term (current) use of opiate analgesic: Secondary | ICD-10-CM | POA: Diagnosis not present

## 2019-11-03 DIAGNOSIS — Z79899 Other long term (current) drug therapy: Secondary | ICD-10-CM | POA: Diagnosis not present

## 2019-11-10 DIAGNOSIS — Z79891 Long term (current) use of opiate analgesic: Secondary | ICD-10-CM | POA: Diagnosis not present

## 2019-11-10 DIAGNOSIS — Z79899 Other long term (current) drug therapy: Secondary | ICD-10-CM | POA: Diagnosis not present

## 2019-11-10 DIAGNOSIS — F1120 Opioid dependence, uncomplicated: Secondary | ICD-10-CM | POA: Diagnosis not present

## 2019-11-17 DIAGNOSIS — Z79891 Long term (current) use of opiate analgesic: Secondary | ICD-10-CM | POA: Diagnosis not present

## 2019-11-17 DIAGNOSIS — F1120 Opioid dependence, uncomplicated: Secondary | ICD-10-CM | POA: Diagnosis not present

## 2019-11-17 DIAGNOSIS — Z79899 Other long term (current) drug therapy: Secondary | ICD-10-CM | POA: Diagnosis not present

## 2019-12-01 DIAGNOSIS — G894 Chronic pain syndrome: Secondary | ICD-10-CM | POA: Diagnosis not present

## 2019-12-01 DIAGNOSIS — F1120 Opioid dependence, uncomplicated: Secondary | ICD-10-CM | POA: Diagnosis not present

## 2019-12-01 DIAGNOSIS — Z79891 Long term (current) use of opiate analgesic: Secondary | ICD-10-CM | POA: Diagnosis not present

## 2019-12-01 DIAGNOSIS — Z79899 Other long term (current) drug therapy: Secondary | ICD-10-CM | POA: Diagnosis not present

## 2019-12-21 DIAGNOSIS — M47817 Spondylosis without myelopathy or radiculopathy, lumbosacral region: Secondary | ICD-10-CM | POA: Diagnosis not present

## 2019-12-21 DIAGNOSIS — M5030 Other cervical disc degeneration, unspecified cervical region: Secondary | ICD-10-CM | POA: Diagnosis not present

## 2019-12-21 DIAGNOSIS — M5136 Other intervertebral disc degeneration, lumbar region: Secondary | ICD-10-CM | POA: Diagnosis not present

## 2019-12-21 DIAGNOSIS — G894 Chronic pain syndrome: Secondary | ICD-10-CM | POA: Diagnosis not present

## 2019-12-22 DIAGNOSIS — F1120 Opioid dependence, uncomplicated: Secondary | ICD-10-CM | POA: Diagnosis not present

## 2019-12-22 DIAGNOSIS — Z79899 Other long term (current) drug therapy: Secondary | ICD-10-CM | POA: Diagnosis not present

## 2019-12-22 DIAGNOSIS — Z79891 Long term (current) use of opiate analgesic: Secondary | ICD-10-CM | POA: Diagnosis not present

## 2019-12-30 DIAGNOSIS — R55 Syncope and collapse: Secondary | ICD-10-CM | POA: Diagnosis not present

## 2019-12-30 DIAGNOSIS — R600 Localized edema: Secondary | ICD-10-CM | POA: Diagnosis not present

## 2019-12-30 DIAGNOSIS — G894 Chronic pain syndrome: Secondary | ICD-10-CM | POA: Diagnosis not present

## 2020-01-05 DIAGNOSIS — Z79891 Long term (current) use of opiate analgesic: Secondary | ICD-10-CM | POA: Diagnosis not present

## 2020-01-05 DIAGNOSIS — Z79899 Other long term (current) drug therapy: Secondary | ICD-10-CM | POA: Diagnosis not present

## 2020-01-05 DIAGNOSIS — F1120 Opioid dependence, uncomplicated: Secondary | ICD-10-CM | POA: Diagnosis not present

## 2020-01-09 DIAGNOSIS — M5030 Other cervical disc degeneration, unspecified cervical region: Secondary | ICD-10-CM | POA: Diagnosis not present

## 2020-01-09 DIAGNOSIS — M47817 Spondylosis without myelopathy or radiculopathy, lumbosacral region: Secondary | ICD-10-CM | POA: Diagnosis not present

## 2020-01-09 DIAGNOSIS — M5136 Other intervertebral disc degeneration, lumbar region: Secondary | ICD-10-CM | POA: Diagnosis not present

## 2020-01-09 DIAGNOSIS — G894 Chronic pain syndrome: Secondary | ICD-10-CM | POA: Diagnosis not present

## 2020-01-19 DIAGNOSIS — F1120 Opioid dependence, uncomplicated: Secondary | ICD-10-CM | POA: Diagnosis not present

## 2020-01-19 DIAGNOSIS — Z79899 Other long term (current) drug therapy: Secondary | ICD-10-CM | POA: Diagnosis not present

## 2020-01-19 DIAGNOSIS — Z79891 Long term (current) use of opiate analgesic: Secondary | ICD-10-CM | POA: Diagnosis not present

## 2020-02-02 DIAGNOSIS — F1120 Opioid dependence, uncomplicated: Secondary | ICD-10-CM | POA: Diagnosis not present

## 2020-02-02 DIAGNOSIS — Z79899 Other long term (current) drug therapy: Secondary | ICD-10-CM | POA: Diagnosis not present

## 2020-02-02 DIAGNOSIS — Z79891 Long term (current) use of opiate analgesic: Secondary | ICD-10-CM | POA: Diagnosis not present

## 2020-05-28 DIAGNOSIS — Z23 Encounter for immunization: Secondary | ICD-10-CM | POA: Diagnosis not present

## 2020-05-28 DIAGNOSIS — E7800 Pure hypercholesterolemia, unspecified: Secondary | ICD-10-CM | POA: Diagnosis not present

## 2020-05-28 DIAGNOSIS — Z0000 Encounter for general adult medical examination without abnormal findings: Secondary | ICD-10-CM | POA: Diagnosis not present

## 2020-05-28 DIAGNOSIS — I10 Essential (primary) hypertension: Secondary | ICD-10-CM | POA: Diagnosis not present

## 2020-06-14 DIAGNOSIS — F17200 Nicotine dependence, unspecified, uncomplicated: Secondary | ICD-10-CM | POA: Diagnosis not present

## 2020-06-14 DIAGNOSIS — M950 Acquired deformity of nose: Secondary | ICD-10-CM | POA: Diagnosis not present

## 2020-06-14 DIAGNOSIS — K1321 Leukoplakia of oral mucosa, including tongue: Secondary | ICD-10-CM | POA: Diagnosis not present

## 2021-05-31 DIAGNOSIS — Z23 Encounter for immunization: Secondary | ICD-10-CM | POA: Diagnosis not present

## 2021-05-31 DIAGNOSIS — Z0000 Encounter for general adult medical examination without abnormal findings: Secondary | ICD-10-CM | POA: Diagnosis not present

## 2021-05-31 DIAGNOSIS — I10 Essential (primary) hypertension: Secondary | ICD-10-CM | POA: Diagnosis not present

## 2021-07-25 DIAGNOSIS — F17200 Nicotine dependence, unspecified, uncomplicated: Secondary | ICD-10-CM | POA: Diagnosis not present

## 2021-07-25 DIAGNOSIS — K219 Gastro-esophageal reflux disease without esophagitis: Secondary | ICD-10-CM | POA: Diagnosis not present

## 2021-08-19 ENCOUNTER — Encounter: Payer: Self-pay | Admitting: Dermatology

## 2021-08-19 ENCOUNTER — Other Ambulatory Visit: Payer: Self-pay

## 2021-08-19 ENCOUNTER — Ambulatory Visit: Payer: BC Managed Care – PPO | Admitting: Dermatology

## 2021-08-19 DIAGNOSIS — L920 Granuloma annulare: Secondary | ICD-10-CM

## 2021-08-19 DIAGNOSIS — D485 Neoplasm of uncertain behavior of skin: Secondary | ICD-10-CM

## 2021-08-19 NOTE — Patient Instructions (Addendum)
Mounjaro and wegovy ( weight loss) ? ?Granuloma annulare  ?Sarcoidosis ?Cutaneous lymphoma ? ? ?Biopsy, Surgery (Curettage) & Surgery (Excision) Aftercare Instructions ? ?1. Okay to remove bandage in 24 hours ? ?2. Wash area with soap and water ? ?3. Apply Vaseline to area twice daily until healed (Not Neosporin) ? ?4. Okay to cover with a Band-Aid to decrease the chance of infection or prevent irritation from clothing; also it's okay to uncover lesion at home. ? ?5. Suture instructions: return to our office in 7-10 or 10-14 days for a nurse visit for suture removal. Variable healing with sutures, if pain or itching occurs call our office. It's okay to shower or bathe 24 hours after sutures are given. ? ?6. The following risks may occur after a biopsy, curettage or excision: bleeding, scarring, discoloration, recurrence, infection (redness, yellow drainage, pain or swelling). ? ?7. For questions, concerns and results call our office at [facility] before 4pm & Friday before 3pm. Biopsy results will be available in 1 week. ? ?

## 2021-08-26 ENCOUNTER — Telehealth: Payer: Self-pay | Admitting: *Deleted

## 2021-08-26 MED ORDER — CLOBETASOL PROP EMOLLIENT BASE 0.05 % EX CREA: TOPICAL_CREAM | CUTANEOUS | 2 refills | Status: AC

## 2021-08-26 NOTE — Telephone Encounter (Signed)
Pathology to patient- clobetasol cream sent to patient  pharmacy and told if too expensive do goodrx couon and pay cash. Patient understood.  ?

## 2021-08-26 NOTE — Telephone Encounter (Signed)
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  The cause of this condition is not understood and there is no true cure, but it is rather common for it to eventually disappear.  She can initially try prescription clobetasol cream daily after bathing applied to all spots except except the two biopsies for 6 to 8 weeks.  Schedule a follow-up at that time and we can check progress and possibly discuss alternative therapies. ?

## 2021-08-28 ENCOUNTER — Encounter: Payer: Self-pay | Admitting: Dermatology

## 2021-08-28 NOTE — Progress Notes (Signed)
? ?  New Patient ?  ?Subjective  ?Brittany Moon is a 61 y.o. female who presents for the following: Skin Problem (Lesions on arms x 8 months ). ? ?Spots on arms over the past 8 months.  No previous treatment. ?Location:  ?Duration:  ?Quality:  ?Associated Signs/Symptoms: ?Modifying Factors:  ?Severity:  ?Timing: ?Context:  ? ? ?The following portions of the chart were reviewed this encounter and updated as appropriate:  Tobacco  Allergies  Meds  Problems  Med Hx  Surg Hx  Fam Hx   ?  ? ?Objective  ?Well appearing patient in no apparent distress; mood and affect are within normal limits. ?Right Wrist - Posterior ?1 cm plaque-like plus Arkell form dermal papules and nodules.  Suspect granulomatous process, favor granuloma annulare over sarcoidosis or infection. ? ? ? ? ?Right Forearm - Posterior ? ? ? ? ?Right Arm ?Dermal papules and nodules compatible with"childhood" plus nodular granuloma annulare. ? ? ? ?A focused examination was performed including head, neck, lymph nodes, arms, hands, nails.. Relevant physical exam findings are noted in the Assessment and Plan. ? ? ?Assessment & Plan  ?Neoplasm of uncertain behavior of skin (2) ?Right Wrist - Posterior ? ?Skin / nail biopsy ?Type of biopsy: punch   ?Informed consent: discussed and consent obtained   ?Procedure prep:  Patient was prepped and draped in usual sterile fashion (nonsterile) ?Prep type:  Chlorhexidine ?Anesthesia: the lesion was anesthetized in a standard fashion   ?Anesthetic:  1% lidocaine w/ epinephrine 1-100,000 local infiltration ?Punch size:  3 mm ?Suture size:  4-0 ?Suture removal (days):  14 ?Hemostasis achieved with: suture   ?Outcome: patient tolerated procedure well   ?Post-procedure details: wound care instructions given   ?Post-procedure details comment:  Nonsterile ? ?Specimen 1 - Surgical pathology ?Differential Diagnosis: r/o GA vs cutaneous lymphoma vs sarcoidosis  ? ?Check Margins: No ? ?Right Forearm - Posterior ? ?Skin / nail  biopsy ?Type of biopsy: punch   ?Informed consent: discussed and consent obtained   ?Procedure prep:  Patient was prepped and draped in usual sterile fashion (nonsterile) ?Prep type:  Chlorhexidine ?Anesthesia: the lesion was anesthetized in a standard fashion   ?Anesthetic:  1% lidocaine w/ epinephrine 1-100,000 local infiltration ?Punch size:  3 mm ?Suture size:  4-0 ?Suture removal (days):  14 ?Hemostasis achieved with: suture   ?Outcome: patient tolerated procedure well   ?Post-procedure details: wound care instructions given   ?Post-procedure details comment:  Nonsterile ? ?Specimen 2 - Surgical pathology ?Differential Diagnosis: r/o ga vs cutaneous lymphoma vs sarcoidosis ? ?Check Margins: No  ? ?Will obtain 2 punch biopsies Center for treatment until this result is completed. ? 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 ?I discussed the nature of this enigmatic disorder in some detail.  No intervention initiated until biopsies are completed. ? ? ?

## 2021-09-02 ENCOUNTER — Ambulatory Visit: Payer: BC Managed Care – PPO

## 2023-07-31 ENCOUNTER — Other Ambulatory Visit: Payer: Self-pay | Admitting: Internal Medicine

## 2023-07-31 DIAGNOSIS — F17210 Nicotine dependence, cigarettes, uncomplicated: Secondary | ICD-10-CM

## 2023-08-27 ENCOUNTER — Ambulatory Visit
Admission: RE | Admit: 2023-08-27 | Discharge: 2023-08-27 | Disposition: A | Discharge status: Home / Self Care | Payer: BC Managed Care – PPO | Source: Ambulatory Visit | Attending: Internal Medicine | Admitting: Internal Medicine

## 2023-08-27 DIAGNOSIS — F17210 Nicotine dependence, cigarettes, uncomplicated: Secondary | ICD-10-CM
